# Patient Record
Sex: Female | Born: 1935 | Race: Black or African American | Hispanic: No | Smoking: Never smoker
Health system: Southern US, Community
[De-identification: ages and names within clinical notes are randomized; demographics above are authoritative.]

## PROBLEM LIST (undated history)

## (undated) DIAGNOSIS — F0280 Dementia in other diseases classified elsewhere without behavioral disturbance: Secondary | ICD-10-CM

## (undated) DIAGNOSIS — G309 Alzheimer's disease, unspecified: Secondary | ICD-10-CM

## (undated) DIAGNOSIS — K5790 Diverticulosis of intestine, part unspecified, without perforation or abscess without bleeding: Secondary | ICD-10-CM

## (undated) DIAGNOSIS — N289 Disorder of kidney and ureter, unspecified: Secondary | ICD-10-CM

## (undated) DIAGNOSIS — I10 Essential (primary) hypertension: Secondary | ICD-10-CM

## (undated) DIAGNOSIS — R7303 Prediabetes: Secondary | ICD-10-CM

## (undated) HISTORY — PX: NO PAST SURGERIES: SHX2092

---

## 2005-04-09 ENCOUNTER — Ambulatory Visit: Payer: Self-pay

## 2006-10-20 ENCOUNTER — Emergency Department: Payer: Self-pay | Admitting: Emergency Medicine

## 2006-10-22 ENCOUNTER — Ambulatory Visit: Payer: Self-pay | Admitting: Surgery

## 2006-10-22 ENCOUNTER — Other Ambulatory Visit: Payer: Self-pay

## 2006-11-27 ENCOUNTER — Ambulatory Visit: Payer: Self-pay | Admitting: Surgery

## 2015-12-30 ENCOUNTER — Encounter: Payer: Self-pay | Admitting: Emergency Medicine

## 2015-12-30 ENCOUNTER — Observation Stay
Admission: EM | Admit: 2015-12-30 | Discharge: 2016-01-01 | Disposition: A | Discharge status: Home / Self Care | Payer: Medicare (Managed Care) | Attending: Internal Medicine | Admitting: Internal Medicine

## 2015-12-30 ENCOUNTER — Emergency Department: Payer: Medicare (Managed Care)

## 2015-12-30 DIAGNOSIS — Z79899 Other long term (current) drug therapy: Secondary | ICD-10-CM | POA: Insufficient documentation

## 2015-12-30 DIAGNOSIS — G9341 Metabolic encephalopathy: Secondary | ICD-10-CM | POA: Diagnosis not present

## 2015-12-30 DIAGNOSIS — Z7982 Long term (current) use of aspirin: Secondary | ICD-10-CM | POA: Insufficient documentation

## 2015-12-30 DIAGNOSIS — F0390 Unspecified dementia without behavioral disturbance: Secondary | ICD-10-CM | POA: Diagnosis not present

## 2015-12-30 DIAGNOSIS — B9620 Unspecified Escherichia coli [E. coli] as the cause of diseases classified elsewhere: Secondary | ICD-10-CM | POA: Insufficient documentation

## 2015-12-30 DIAGNOSIS — N390 Urinary tract infection, site not specified: Secondary | ICD-10-CM | POA: Diagnosis present

## 2015-12-30 DIAGNOSIS — I10 Essential (primary) hypertension: Secondary | ICD-10-CM | POA: Diagnosis not present

## 2015-12-30 DIAGNOSIS — R410 Disorientation, unspecified: Secondary | ICD-10-CM | POA: Diagnosis present

## 2015-12-30 HISTORY — DX: Essential (primary) hypertension: I10

## 2015-12-30 LAB — URINALYSIS COMPLETE WITH MICROSCOPIC (ARMC ONLY)
BILIRUBIN URINE: NEGATIVE
GLUCOSE, UA: NEGATIVE mg/dL
HGB URINE DIPSTICK: NEGATIVE
KETONES UR: NEGATIVE mg/dL
NITRITE: POSITIVE — AB
Protein, ur: 30 mg/dL — AB
RBC / HPF: NONE SEEN RBC/hpf (ref 0–5)
SPECIFIC GRAVITY, URINE: 1.019 (ref 1.005–1.030)
pH: 5 (ref 5.0–8.0)

## 2015-12-30 LAB — COMPREHENSIVE METABOLIC PANEL
ALK PHOS: 101 U/L (ref 38–126)
ALT: 25 U/L (ref 14–54)
ANION GAP: 5 (ref 5–15)
AST: 23 U/L (ref 15–41)
Albumin: 3.8 g/dL (ref 3.5–5.0)
BILIRUBIN TOTAL: 0.5 mg/dL (ref 0.3–1.2)
BUN: 15 mg/dL (ref 6–20)
CALCIUM: 9.3 mg/dL (ref 8.9–10.3)
CO2: 27 mmol/L (ref 22–32)
Chloride: 107 mmol/L (ref 101–111)
Creatinine, Ser: 0.9 mg/dL (ref 0.44–1.00)
GFR calc non Af Amer: 58 mL/min — ABNORMAL LOW (ref >60)
Glucose, Bld: 86 mg/dL (ref 65–99)
POTASSIUM: 3.7 mmol/L (ref 3.5–5.1)
Sodium: 139 mmol/L (ref 135–145)
TOTAL PROTEIN: 8.3 g/dL — AB (ref 6.5–8.1)

## 2015-12-30 LAB — CBC WITH DIFFERENTIAL/PLATELET
BASOS ABS: 0 10*3/uL (ref 0–0.1)
Basophils Relative: 1 %
Eosinophils Absolute: 0.1 10*3/uL (ref 0–0.7)
Eosinophils Relative: 1 %
HEMATOCRIT: 43.4 % (ref 35.0–47.0)
Hemoglobin: 14.3 g/dL (ref 12.0–16.0)
LYMPHS ABS: 1.2 10*3/uL (ref 1.0–3.6)
LYMPHS PCT: 14 %
MCH: 28.6 pg (ref 26.0–34.0)
MCHC: 32.9 g/dL (ref 32.0–36.0)
MCV: 87 fL (ref 80.0–100.0)
MONO ABS: 0.6 10*3/uL (ref 0.2–0.9)
MONOS PCT: 7 %
NEUTROS ABS: 6.4 10*3/uL (ref 1.4–6.5)
Neutrophils Relative %: 77 %
Platelets: 150 10*3/uL (ref 150–440)
RBC: 5 MIL/uL (ref 3.80–5.20)
RDW: 14.3 % (ref 11.5–14.5)
WBC: 8.2 10*3/uL (ref 3.6–11.0)

## 2015-12-30 LAB — LIPASE, BLOOD: LIPASE: 56 U/L — AB (ref 11–51)

## 2015-12-30 MED ORDER — DONEPEZIL HCL 5 MG PO TABS
5.0000 mg | ORAL_TABLET | Freq: Every day | ORAL | Status: DC
  Administered 2015-12-30 – 2015-12-31 (×2): 5 mg via ORAL
  Filled 2015-12-30 (×2): qty 1

## 2015-12-30 MED ORDER — ENOXAPARIN SODIUM 40 MG/0.4ML ~~LOC~~ SOLN
40.0000 mg | SUBCUTANEOUS | Status: DC
  Administered 2015-12-30 – 2015-12-31 (×2): 40 mg via SUBCUTANEOUS
  Filled 2015-12-30 (×2): qty 0.4

## 2015-12-30 MED ORDER — ACETAMINOPHEN 325 MG PO TABS: 650.0000 mg | ORAL_TABLET | Freq: Four times a day (QID) | ORAL | Status: DC | PRN

## 2015-12-30 MED ORDER — DEXTROSE 5 % IV SOLN
1.0000 g | Freq: Once | INTRAVENOUS | Status: AC
  Administered 2015-12-30: 1 g via INTRAVENOUS

## 2015-12-30 MED ORDER — ONDANSETRON HCL 4 MG PO TABS
4.0000 mg | ORAL_TABLET | Freq: Four times a day (QID) | ORAL | Status: DC | PRN
Start: 2015-12-30 — End: 2016-01-01

## 2015-12-30 MED ORDER — AMLODIPINE BESYLATE 5 MG PO TABS
5.0000 mg | ORAL_TABLET | Freq: Every day | ORAL | Status: DC
  Administered 2015-12-31 – 2016-01-01 (×2): 5 mg via ORAL
  Filled 2015-12-30 (×2): qty 1

## 2015-12-30 MED ORDER — AMLODIPINE BESYLATE 5 MG PO TABS
5.0000 mg | ORAL_TABLET | Freq: Once | ORAL | Status: AC
  Administered 2015-12-30: 5 mg via ORAL

## 2015-12-30 MED ORDER — ACETAMINOPHEN 650 MG RE SUPP: 650.0000 mg | Freq: Four times a day (QID) | RECTAL | Status: DC | PRN

## 2015-12-30 MED ORDER — DEXTROSE 5 % IV SOLN
1.0000 g | INTRAVENOUS | Status: DC
  Filled 2015-12-30: qty 10

## 2015-12-30 MED ORDER — SODIUM CHLORIDE 0.9 % IV SOLN
INTRAVENOUS | Status: DC
  Administered 2015-12-30 – 2015-12-31 (×2): via INTRAVENOUS

## 2015-12-30 MED ORDER — OXYCODONE HCL 5 MG PO TABS: 5.0000 mg | ORAL_TABLET | ORAL | Status: DC | PRN

## 2015-12-30 MED ORDER — ASPIRIN EC 81 MG PO TBEC
81.0000 mg | DELAYED_RELEASE_TABLET | Freq: Every day | ORAL | Status: DC
  Filled 2015-12-30: qty 1

## 2015-12-30 MED ORDER — ONDANSETRON HCL 4 MG/2ML IJ SOLN: 4.0000 mg | Freq: Four times a day (QID) | INTRAMUSCULAR | Status: DC | PRN

## 2015-12-30 MED ORDER — SODIUM CHLORIDE 0.9 % IV BOLUS (SEPSIS)
500.0000 mL | Freq: Once | INTRAVENOUS | Status: AC
  Administered 2015-12-30: 500 mL via INTRAVENOUS

## 2015-12-30 NOTE — H&P (Signed)
[facility] [place] at [facility]   PATIENT NAME: Neena Beecham    MR#:  [id number]  DATE OF BIRTH:  January 27, 1931   DATE OF ADMISSION:  12/30/2015  PRIMARY CARE PHYSICIAN: No primary care provider on file.   REQUESTING/REFERRING PHYSICIAN: Quale  CHIEF COMPLAINT:   Chief Complaint  Patient presents with  . Weakness  Confusion  HISTORY OF PRESENT ILLNESS:  Odette Watanabe  is a 82 y.o. female with a known history of Dementia, essential hypertension who is presenting for nursing facility with worsening mental status. Patient unable to provide meaningful information given mental status history obtained from emergency department staff. One day duration of patient "moving slow" smelling of urine brought to Hospital further workup and evaluation. Once again patient unable to provide meaningful information given mental status  PAST MEDICAL HISTORY:   Past Medical History:  Diagnosis Date  . Hypertension     PAST SURGICAL HISTORY:   Past Surgical History:  Procedure Laterality Date  . NO PAST SURGERIES      SOCIAL HISTORY:   Social History  Substance Use Topics  . Smoking status: Never Smoker  . Smokeless tobacco: Never Used  . Alcohol use Not on file    FAMILY HISTORY:   Family History  Problem Relation Age of Onset  . Hypertension Other     DRUG ALLERGIES:  Allergies no known allergies  REVIEW OF SYSTEMS:  Unable to obtain given mental status medical condition   MEDICATIONS AT HOME:   Prior to Admission medications   Medication Sig Start Date End Date Taking? Authorizing Provider  amLODipine (NORVASC) 5 MG tablet Take 5 mg by mouth daily.   Yes Historical Provider, MD  aspirin 81 MG tablet Take 81 mg by mouth daily.   Yes Historical Provider, MD  donepezil (ARICEPT) 5 MG tablet Take 5 mg by mouth at bedtime.   Yes Historical Provider, MD      VITAL SIGNS:  Blood pressure (!) 147/90, pulse 61, temperature 97.8 F (36.6 C), temperature  source Rectal, resp. rate 16, height  (1.575 m), weight 69.9 kg (154 lb), SpO2 98 %.  PHYSICAL EXAMINATION:   VITAL SIGNS: Vitals:   12/30/15 1135 12/30/15 1222  BP:  (!) 147/90  Pulse:    Resp:    Temp: 97.8 F (36.6 C)    GENERAL:82 y.o.female moderate distress given mental status.  HEAD: Normocephalic, atraumatic.  EYES: Pupils equal, round, reactive to light. Unable to assess extraocular muscles given mental status/medical condition. No scleral icterus.  MOUTH: Moist mucosal membrane. Dentition intact. No abscess noted.  EAR, NOSE, THROAT: Clear without exudates. No external lesions.  NECK: Supple. No thyromegaly. No nodules. No JVD.  PULMONARY: Clear to ascultation, without wheeze rails or rhonci. No use of accessory muscles, Good respiratory effort. good air entry bilaterally CHEST: Nontender to palpation.  CARDIOVASCULAR: S1 and S2. Regular rate and rhythm. No murmurs, rubs, or gallops. No edema. Pedal pulses 2+ bilaterally.  GASTROINTESTINAL: Soft, nontender, nondistended. No masses. Positive bowel sounds. No hepatosplenomegaly.  MUSCULOSKELETAL: No swelling, clubbing, or edema. Range of motion full in all extremities.  NEUROLOGIC: Unable to assess given mental status/medical condition SKIN: No ulceration, lesions, rashes, or cyanosis. Skin warm and dry. Turgor intact.  PSYCHIATRIC: Unable to assess given mental status/medical condition-patient can mumble incoherently and nod head but unable to follow directions or answer questions     LABORATORY PANEL:   CBC  Recent Labs Lab 12/30/15 1157  WBC 8.2  HGB 14.3  HCT 43.4  PLT 150   ------------------------------------------------------------------------------------------------------------------  Chemistries   Recent Labs Lab 12/30/15 1157  NA 139  K 3.7  CL 107  CO2 27  GLUCOSE 86  BUN 15  CREATININE 0.90  CALCIUM 9.3  AST 23  ALT 25  ALKPHOS 101  BILITOT 0.5    ------------------------------------------------------------------------------------------------------------------  Cardiac Enzymes No results for input(s): TROPONINI in the last 168 hours. ------------------------------------------------------------------------------------------------------------------  RADIOLOGY:  Dg Chest Portable 1 View  Result Date: 12/30/2015 CLINICAL DATA:  Confusion EXAM: PORTABLE CHEST 1 VIEW COMPARISON:  None. FINDINGS: Heart size is top-normal accounting for portable hypoinspiratory technique. Normal mediastinal contour No pneumothorax. No pleural effusion. Lungs appear clear, with no acute consolidative airspace disease and no pulmonary edema. Surgical clips in the right upper quadrant of the abdomen. IMPRESSION: Limited hypoinspiratory portable chest radiographs with no active disease. Electronically Signed   By: Delbert PhenixJason A Poff M.D.   On: 12/30/2015 11:56    EKG:   Orders placed or performed during the hospital encounter of 12/30/15  . ED EKG  . ED EKG    IMPRESSION AND PLAN:   82 year old African-American female history of essential hypertension, dementia presenting with worsening mental status and weakness.  1. Acute  metabolic encephalopathy secondary to urinary tract infection site unspecified: Ceftriaxone, follow urine culture  2. Essential hypertension: Norvasc 3. Dementia: Donepezil  Patient family unable to be reached at this time    All the records are reviewed and case discussed with ED provider. Management plans discussed with the patient, family and they are in agreement.  CODE STATUS: full   TOTAL TIME TAKING CARE OF THIS PATIENT: 33 minutes.    Nakaya Mishkin,  Mardi MainlandDavid K M.D on 12/30/2015 at 1:30 PM  Between 7am to 6pm - Pager - [phone]  After 6pm: House Pager: - [phone]  [facility] [place] [facility]  Office  [phone]  CC: Primary care physician; No primary care provider on file.

## 2015-12-30 NOTE — Progress Notes (Signed)
Spoke with pts niece Radiographer, therapeuticmeeta Clobett who confirms pt has no allergies. Notified Jennifer with pharmacy.

## 2015-12-30 NOTE — ED Provider Notes (Signed)
[facility] Emergency Department Provider Note ____________________________________________   First MD Initiated Contact with Patient 12/30/15 1106     (approximate)  I have reviewed the triage vital signs and the nursing notes.   HISTORY  Chief Complaint Weakness  EM caveat: The patient has severe dementia, altered mental status and is not speaking  HPI Nancy Montgomery is a 82 y.o. female the past medical history of dementia, hypertension.  History is very limited. Evidently the patient vomited once some time in the last 24 hours, and though she has dementia it seems to be slightly worse today prompting ER evaluation. EMS noted foul-smelling odor to the urine.  Patient herself does not appear in pain or have complaint. She smiles, giggles slightly, but does not carry on any conversation.   Past Medical History:  Diagnosis Date  . Hypertension     Hypertension   Patient Active Problem List   Diagnosis Date Noted  . Acute delirium 12/30/2015  . Metabolic encephalopathy 12/30/2015    Past Surgical History:  Procedure Laterality Date  . NO PAST SURGERIES      Prior to Admission medications   Medication Sig Start Date End Date Taking? Authorizing Provider  amLODipine (NORVASC) 5 MG tablet Take 5 mg by mouth daily.   Yes Historical Provider, MD  aspirin 81 MG tablet Take 81 mg by mouth daily.   Yes Historical Provider, MD  donepezil (ARICEPT) 5 MG tablet Take 5 mg by mouth at bedtime.   Yes Historical Provider, MD  Norvasc Baby aspirin   Allergies Review of patient's allergies indicates no known allergies.  Family History  Problem Relation Age of Onset  . Hypertension Other     Social History Social History  Substance Use Topics  . Smoking status: Never Smoker  . Smokeless tobacco: Never Used  . Alcohol use Not on file  Unknown EM caveat  Review of Systems EM caveat  ____________________________________________   PHYSICAL  EXAM:  VITAL SIGNS: ED Triage Vitals  Enc Vitals Group     BP 12/30/15 1047 (!) 142/76     Pulse Rate 12/30/15 1047 61     Resp 12/30/15 1047 16     Temp 12/30/15 1047 (!) 96.5 F (35.8 C)     Temp Source 12/30/15 1047 Axillary     SpO2 12/30/15 1047 98 %     Weight 12/30/15 1052 154 lb (69.9 kg)     Height 12/30/15 1052 5\' 2"  (1.575 m)     Head Circumference --      Peak Flow --      Pain Score --      Pain Loc --      Pain Edu? --      Excl. in GC? --    EM caveat  Constitutional: Alert and sitting up, smiling, in no distress.  Eyes: Conjunctivae are normal. PERRL. EOMI. Head: Atraumatic. Nose: No congestion/rhinnorhea. Mouth/Throat: Mucous membranes are moist.  Oropharynx non-erythematous. Neck: No stridor.  No meningismus Cardiovascular: Normal rate, regular rhythm. Grossly normal heart sounds.  Good peripheral circulation. Respiratory: Normal respiratory effort.  No retractions. Lungs CTAB. Gastrointestinal: Soft and nontender. No distention.  Musculoskeletal: No lower extremity tenderness nor edema. Neurologic:  The patient does not verbalize. She does giggle. Her smile is noted to be equal. She moves all extremities with equal strength, and is picking slightly at her pulse oximetry and telemetry cables at times. Neuro examination and details difficult because of the patient's mental status.  She does not follow commands and does not verbalize. No gross focal neurologic deficits are appreciated. Skin:  Skin is warm, dry and intact. No rash noted. Psychiatric: Mood and affect are calm, though occasionally picking at cables.  ____________________________________________   LABS (all labs ordered are listed, but only abnormal results are displayed)  Labs Reviewed  COMPREHENSIVE METABOLIC PANEL - Abnormal; Notable for the following:       Result Value   Total Protein 8.3 (*)    GFR calc non Af Amer 58 (*)    All other components within normal limits  LIPASE, BLOOD -  Abnormal; Notable for the following:    Lipase 56 (*)    All other components within normal limits  URINALYSIS COMPLETEWITH MICROSCOPIC ([facility] ONLY) - Abnormal; Notable for the following:    Color, Urine AMBER (*)    APPearance CLOUDY (*)    Protein, ur 30 (*)    Nitrite POSITIVE (*)    Leukocytes, UA 1+ (*)    Bacteria, UA RARE (*)    Squamous Epithelial / LPF 0-5 (*)    All other components within normal limits  URINE CULTURE  CBC WITH DIFFERENTIAL/PLATELET   ____________________________________________  EKG  Reviewed and interpreted by me at 1236 Heart rate 70 QRS 80 QTc 450 Normal sinus rhythm, questionable right atrial enlargement, no evidence of acute ischemia. ____________________________________________  RADIOLOGY  Dg Chest Portable 1 View  Result Date: 12/30/2015 CLINICAL DATA:  Confusion EXAM: PORTABLE CHEST 1 VIEW COMPARISON:  None. FINDINGS: Heart size is top-normal accounting for portable hypoinspiratory technique. Normal mediastinal contour No pneumothorax. No pleural effusion. Lungs appear clear, with no acute consolidative airspace disease and no pulmonary edema. Surgical clips in the right upper quadrant of the abdomen. IMPRESSION: Limited hypoinspiratory portable chest radiographs with no active disease. Electronically Signed   By: Delbert Phenix M.D.   On: 12/30/2015 11:56    ____________________________________________   PROCEDURES  Procedure(s) performed: None  Procedures  Critical Care performed: No  ____________________________________________   INITIAL IMPRESSION / ASSESSMENT AND PLAN / ED COURSE  Pertinent labs & imaging results that were available during my care of the patient were reviewed by me and considered in my medical decision making (see chart for details).    Attempted to call the patient's listed emergency contact, Birdena Crandall, but returns that the line is no longer valid.  The patient presents for concerns of altered mental status.  She appears to be delirious, picking at cables, giggling inappropriately at times, but shows no evidence of focal deficit. Her core temperature is notably slightly low, and this raises suspicion for possible metabolic and/or infectious etiology. I see no evidence of acute neurologic deficit, though further evaluation does not show an acute cause that would explain potential delirium and alteration of mental status proceed with CT of the head.  ----------------------------------------- 1:04 PM on 12/30/2015 -----------------------------------------  Second attempt made to reach patient's family, unable. Patient does demonstrate findings consistent with a urinary tract infection with rare bacteria, leukocytes, and many whites. We will admit the patient, initiated IV antibiotic including Rocephin, urine culture. Admit for significant alteration in mental status, symptoms of delirium. Place a social work consult to assist in contacting patient family.  Clinical Course  Value Comment By Time  Sodium: 139 (Reviewed) Sharyn Creamer, MD 08/13 1310     ____________________________________________   FINAL CLINICAL IMPRESSION(S) / ED DIAGNOSES  Final diagnoses:  Delirium  Acute urinary tract infection      NEW MEDICATIONS STARTED  DURING THIS VISIT:  New Prescriptions   No medications on file     Note:  This document was prepared using Dragon voice recognition software and may include unintentional dictation errors.     Sharyn Creamer, MD 12/30/15 1359

## 2015-12-30 NOTE — ED Triage Notes (Signed)
Pt arrive by EMS after family called stating that the pt was "moving slower than normal" today and had vomited once upon waking. Pt has hx of dementia and is unable to answer questions. EMS did notice a "strong urine" smell upon arrival.

## 2015-12-30 NOTE — ED Notes (Signed)
Informed RN bed ready  1351

## 2015-12-30 NOTE — Progress Notes (Signed)
Spoke with RN - patient unable to communicate and no family present to verify allergies.   Nancy Montgomery 5:51 PM

## 2015-12-30 NOTE — ED Notes (Signed)
Pt triaged as much as possible at this time. Pt unable to answer questions. Per EMS family are on the way to ED.

## 2015-12-30 NOTE — ED Notes (Signed)
Assisted RN with cleaning pt and getting rectal temp. Assisted x-ray with holding pt while x-ray performed

## 2015-12-30 NOTE — ED Notes (Signed)
Socks placed on pt.  

## 2015-12-30 NOTE — Clinical Social Work Note (Signed)
Clinical Social Work Assessment  Patient Details  Name: Nancy Montgomery MRN: [id number] Date of Birth: 11/29/1930  Date of referral:  12/30/15               Reason for consult:  Family Concerns                Permission sought to share information with:  Family Supports Permission granted to share information::     Name::     Agricultural engineerCoretta Losito  Agency::     Relationship::     Contact Information:     Housing/Transportation Living arrangements for the past 2 months:  Single Family Home Source of Information:  Other (Comment Required) (Patient's niece) Patient Interpreter Needed:  None Criminal Activity/Legal Involvement Pertinent to Current Situation/Hospitalization:  No - Comment as needed Significant Relationships:  Other Family Members Lives with:  Spouse Do you feel safe going back to the place where you live?  Yes Need for family participation in patient care:  Yes (Comment)  Care giving concerns:  Patient arrived at ED with AMS and no contact information.   Social Worker assessment / plan:  Patient was alert and oriented x1. Patient is not currently verbalizing; her niece, Marella BileCoretta, was in the room during assessment and provided all information.  Patient lives in a single family home with her husband. Both she and her husband have dementia and are cared for by her niece, Radiographer, therapeuticmeeta Clobett. CSW asked for permission to update the demographic contact info for future use. Family and patient agreed verbally.   Patient at baseline ambulates with no assistance and no DME. She is able to feed independently and receives assistance with bathing and dressing. Patient uses adult diapers due to incontinence of bladder and bowel. PT has been ordered; awaiting recommendations   Employment status:  Retired Health and safety inspectornsurance information:  Teacher, English as a foreign languageManaged Medicare ([employer] [facility] [place]) PT Recommendations:  Not assessed at this time Information / Referral to community resources:     Patient/Family's Response to care:   Patient non-verbal but pleasant. Baseline is speaking.   Patient/Family's Understanding of and Emotional Response to Diagnosis, Current Treatment, and Prognosis:  Patient's family were able to verbalize in their own terms the roles of the CSW in care. Patient and the family thanked the CSW for attention.  Emotional Assessment Appearance:  Appears older than stated age Attitude/Demeanor/Rapport:   (Appropriate) Affect (typically observed):  Appropriate Orientation:  Oriented to Self (Patient has dementia and is AMS from baseline) Alcohol / Substance use:  Never Used Psych involvement (Current and /or in the community):  No (Comment)  Discharge Needs  Concerns to be addressed:  Cognitive Concerns, Discharge Planning Concerns Readmission within the last 30 days:  No Current discharge risk:  Cognitively Impaired Barriers to Discharge:  Continued Medical Work up   UAL CorporationKaren M Baley Lorimer, LCSW 12/30/2015, 4:19 PM

## 2015-12-31 MED ORDER — DEXTROSE 5 % IV SOLN
1.0000 g | INTRAVENOUS | Status: DC
  Filled 2015-12-31: qty 10

## 2015-12-31 MED ORDER — ASPIRIN 81 MG PO CHEW
81.0000 mg | CHEWABLE_TABLET | Freq: Every day | ORAL | Status: DC
  Administered 2015-12-31 – 2016-01-01 (×2): 81 mg via ORAL
  Filled 2015-12-31 (×2): qty 1

## 2015-12-31 NOTE — Care Management Note (Signed)
Case Management Note  Patient Details  Name: Nancy Montgomery MRN: [id number] Date of Birth: 08/31/1930  Subjective/Objective:   Spoke with Charlynn CourtSharita [facility]  [phone] She stated that [facility] would be able to accommodate any patient needs at discharge. Patient had 24 hour supervision and is seen every day. Plan is for patient to return home with [facility] at discharge. More to follow. CSW aware               Action/Plan: Home with [facility] at discharge.  Expected Discharge Date:                  Expected Discharge Plan:  Home w Home Health Services  In-House Referral:  Clinical Social Work  Discharge planning Services  CM Consult  Post Acute Care Choice:    Choice offered to:  NA  DME Arranged:  N/A DME Agency:  NA  HH Arranged:    HH Agency:  Other - See comment ([facility])  Status of Service:  In process, will continue to follow  If discussed at Long Length of Stay Meetings, dates discussed:    Additional Comments:  Adonis HugueninBerkhead, Claudie Brickhouse L, RN 12/31/2015, 11:24 AM

## 2015-12-31 NOTE — Evaluation (Signed)
Physical Therapy Evaluation Patient Details Name: Nancy Montgomery MRN: [id number] DOB: 08/26/1930 Today's Date: 12/31/2015   History of Present Illness  Pt is an 82 yr old female with known history of dementia and hypertension presenting with worsening mental status and 1 day duration of "moving slow" and smelling of urine. Admitted with acute delirium and metabolic encephalopathy. Pt unable to provide meaningful information on evaluation given baseline dementia.   Clinical Impression  Prior to admission, pt was independent with household mobility (no AD) though required assist for ADLs of bathing, dressing, and grooming. Pt lives with her spouse, whom is also with dementia, and niece who is a caretaker for them both. Pt has 24/7 supervision and/or assist via family and 5x/week [facility] services (typically 10:00-16:00). Currently pt is max A x2 for bed mobility (this may be d/t difficulty following commands/unfamiliarity of hospital environment), and min guard x2 for sit <> stand transfers and ambulation x 24600ft without AD.  Pt is with limited ability to follow commands, and thus formal strength and balance testing were unable to be assessed. Pt appears to be near baseline and should be safe to d/c home with 24/7 supervision and assist when medically appropriate. Will follow pt with acute skilled PT while still hospitalized to increase OOB activity.     Follow Up Recommendations No PT follow up;Supervision/Assistance - 24 hour    Equipment Recommendations  None recommended by PT    Recommendations for Other Services       Precautions / Restrictions Precautions Precautions: Fall Restrictions Weight Bearing Restrictions: No      Mobility  Bed Mobility Overal bed mobility: Needs Assistance;+2 for physical assistance Bed Mobility: Supine to Sit Supine to sit: Max assist;+2 for physical assistance General bed mobility comments: Pt with difficulty following commands to sit EOB, thus max A x2  provided to complete the transfer. Pt able to maintain balance sitting EOB with supervision, with and without LE support.  Transfers Overall transfer level: Needs assistance Equipment used: 1 person hand held assist Transfers: Sit to/from Stand Sit to Stand: Min guard;+2 safety General transfer comment: Pt utilizing bilat handheld assist to initiate the transfer, but does not rely on the UE support to assume stand. Min guard x2 for safety but no physical assist required. Increased time.  Ambulation/Gait Ambulation/Gait assistance: Min guard;+2 safety Ambulation Distance (Feet): 200 Feet Assistive device: None Gait Pattern/deviations: WFL(Within Functional Limits);Step-through pattern;Trunk flexed General Gait Details: Pt able to ambulate RN unit with min guard x2 for safety. No significant gait deviations or LOB. Fluctuating gait speed but grossly WFL. Occasional hand held assist for steering/obstacle navigation.  Stairs      Balance Overall balance assessment: Needs assistance Sitting-balance support: Bilateral upper extremity supported Sitting balance-Leahy Scale: Good Standing balance support: No upper extremity supported Standing balance-Leahy Scale: Good      Pertinent Vitals/Pain Pain Assessment:  (0/10 via PAINAD scale)  HR and O2 monitored throughout session and maintained WFL.     Home Living Family/patient expects to be discharged to:: Private residence Living Arrangements: Spouse/significant other;Other relatives Available Help at Discharge: Family;Friend(s);Available 24 hours/day ([facility] services) Type of Home: House Home Access: Stairs to enter Entrance Stairs-Rails: Left Entrance Stairs-Number of Steps: 2 Home Layout: One level   Prior Function Level of Independence: Independent;Needs assistance  Comments: Pt is largely non-communicative d/t baseline dementa; home environment and PLOF obtained throught chart review and conversation with pt's neice. Pt able to  ambulate "all over" independently without AD. Requires assist with  bathing and dressing. Has 24/7 supervision[facility]      Extremity/TrunkAssessment   Upper Extremity Assessment: Generalized weakness  Lower Extremity Assessment: Generalized weakness  Cervical / Trunk Assessment: Kyphotic    Communication   Communication: Expressive difficulties (Pt largely non-communicative; just giggles)  Cognition Arousal/Alertness: Awake/alert Behavior During Therapy: WFL for tasks assessed/performed Overall Cognitive Status: History of cognitive impairments - at baseline    General Comments Nursing cleared pt for participation in physical therapy.      Exercises Other Exercises Other Exercises: Pt unable to follow commands for supine TherEx this date. Pt able to ambulate 2200ft on unit for functional LE strengthening.      Assessment/Plan    PT Assessment Patient needs continued PT services  PT Diagnosis Generalized weakness   PT Problem List Decreased strength;Decreased mobility  PT Treatment Interventions Gait training;Stair training;Functional mobility training;Therapeutic activities;Therapeutic exercise   PT Goals (Current goals can be found in the Care Plan section) Acute Rehab PT Goals PT Goal Formulation: Patient unable to participate in goal setting Time For Goal Achievement: 01/14/16 Potential to Achieve Goals: Good    Frequency Min 2X/week   Barriers to discharge           End of Session Equipment Utilized During Treatment: Gait belt Activity Tolerance: Patient tolerated treatment well Patient left: in chair;with call bell/phone within reach;with chair alarm set;with family/visitor present Nurse Communication: Mobility status         Time: [id number] PT Time Calculation (min) (ACUTE ONLY): 39 min   Charges:         PT G Codes:        Tobby Fawcett, SPT 12/31/2015, 12:11 PM

## 2015-12-31 NOTE — Progress Notes (Signed)
PT is recommending no PT follow up. RN Case Manager is aware of above. Please reconsult if future social work needs arise. CSW signing off.   Baker Hughes IncorporatedBailey Nivan Melendrez, LCSW [phone]

## 2015-12-31 NOTE — Progress Notes (Signed)
Patient is followed by [facility]. [facility] staff member came to visit patient today. Per [facility] staff member patient has 24/7 supervision and comes to the [facility] center 5 days per week. Per [facility] plan is for patient to return home and PT may recommend SNF however that is patient's baseline. CSW will continue to follow and assist as needed.   B[employer] Hughes [employer]orporatedBailey Sharah Finnell, LCSW [phone]

## 2015-12-31 NOTE — Progress Notes (Signed)
[facility] [place] [facility]   PATIENT NAME: Nancy Montgomery    MR#:  [id number]  DATE OF BIRTH:  10/02/1930  SUBJECTIVE:  CHIEF COMPLAINT:   Chief Complaint  Patient presents with  . Weakness   Sitting in the recliner Dementia and unable to contribute to history  REVIEW OF SYSTEMS:    Review of Systems  Unable to perform ROS: Dementia    DRUG ALLERGIES:  No Known Allergies  VITALS:  Blood pressure 138/70, pulse 68, temperature 98.6 F (37 C), temperature source Oral, resp. rate 18, height 5\' 2"  (1.575 m), weight 69.9 kg (154 lb), SpO2 98 %.  PHYSICAL EXAMINATION:   Physical Exam  GENERAL:  82 y.o.-year-old patient lying in the bed with no acute distress.  EYES: Pupils equal, round, reactive to light and accommodation. No scleral icterus. Extraocular muscles intact.  HEENT: Head atraumatic, normocephalic. Oropharynx and nasopharynx clear.  NECK:  Supple, no jugular venous distention. No thyroid enlargement, no tenderness.  LUNGS: Normal breath sounds bilaterally, no wheezing, rales, rhonchi. No use of accessory muscles of respiration.  CARDIOVASCULAR: S1, S2 normal. No murmurs, rubs, or gallops.  ABDOMEN: Soft, nontender, nondistended. Bowel sounds present. No organomegaly or mass.  EXTREMITIES: No cyanosis, clubbing or edema b/l.    NEUROLOGIC: Moves all 4 extremities equally. Not following commands PSYCHIATRIC: The patient is drowzy SKIN: No obvious rash, lesion, or ulcer.   LABORATORY PANEL:   CBC  Recent Labs Lab 12/30/15 1157  WBC 8.2  HGB 14.3  HCT 43.4  PLT 150   ------------------------------------------------------------------------------------------------------------------ Chemistries   Recent Labs Lab 12/30/15 1157  NA 139  K 3.7  CL 107  CO2 27  GLUCOSE 86  BUN 15  CREATININE 0.90  CALCIUM 9.3  AST 23  ALT 25  ALKPHOS 101  BILITOT 0.5    ------------------------------------------------------------------------------------------------------------------  Cardiac Enzymes No results for input(s): TROPONINI in the last 168 hours. ------------------------------------------------------------------------------------------------------------------  RADIOLOGY:  Dg Chest Portable 1 View  Result Date: 12/30/2015 CLINICAL DATA:  Confusion EXAM: PORTABLE CHEST 1 VIEW COMPARISON:  None. FINDINGS: Heart size is top-normal accounting for portable hypoinspiratory technique. Normal mediastinal contour No pneumothorax. No pleural effusion. Lungs appear clear, with no acute consolidative airspace disease and no pulmonary edema. Surgical clips in the right upper quadrant of the abdomen. IMPRESSION: Limited hypoinspiratory portable chest radiographs with no active disease. Electronically Signed   By: Delbert PhenixJason A Poff M.D.   On: 12/30/2015 11:56     ASSESSMENT AND PLAN:   82 year old African-American female history of essential hypertension, dementia presenting with worsening mental status and weakness.  1. Acute  metabolic encephalopathy over baseline dementia secondary to urinary tract infection Ceftriaxone Urine cx are pending. Niece( HCPOA) at bedside and patient is improving per her.  2. Essential hypertension: Norvasc  3. Dementia: Donepezil  All the records are reviewed and case discussed with Care Management/Social Workerr. Management plans discussed with the patient, family and they are in agreement.  CODE STATUS: FULL CODE  DVT Prophylaxis: SCDs  TOTAL TIME TAKING CARE OF THIS PATIENT: 35 minutes.   POSSIBLE D/C IN 1-2 DAYS, DEPENDING ON CLINICAL CONDITION.  Milagros LollSudini, Maki Hege R M.D on 12/31/2015 at 12:46 PM  Between 7am to 6pm - Pager - [phone]  After 6pm go to [email or web address] - password [email or web address] [email or web address]  [facility]  Office  [phone]  CC: Primary care physician; No primary care provider on file.  Note:  This dictation was prepared with Dragon dictation along with smaller  Company secretary. Any transcriptional errors that result from this process are unintentional.

## 2016-01-01 LAB — URINE CULTURE

## 2016-01-01 MED ORDER — CEPHALEXIN 500 MG PO CAPS: 500.0000 mg | ORAL_CAPSULE | Freq: Four times a day (QID) | ORAL | 0 refills | Status: DC

## 2016-01-01 NOTE — Discharge Summary (Signed)
[facility] [place] at [facility]   PATIENT NAME: Nancy Montgomery    MR#:  [id number]  DATE OF BIRTH:  06/21/1930  DATE OF ADMISSION:  12/30/2015   ADMITTING PHYSICIAN: Wyatt Hasteavid K Hower, MD  DATE OF DISCHARGE: 01/01/2016  3:58 PM  PRIMARY CARE PHYSICIAN: No primary care provider on file.   ADMISSION DIAGNOSIS:  Delirium [R41.0] Acute urinary tract infection [N39.0] DISCHARGE DIAGNOSIS:  Active Problems:   Acute delirium   Metabolic encephalopathy UTI SECONDARY DIAGNOSIS:   Past Medical History:  Diagnosis Date  . Hypertension    HOSPITAL COURSE:  82 year old African-American female history of essential hypertension, dementia presenting with worsening mental status and weakness.  1. Acute metabolic encephalopathy over baseline dementia secondary to urinary tract infection Has been treated with iv Ceftriaxone Urine cx: E Coli. Change to keflex. I discussed with Niece( HCPOA) at bedside.  2. Essential hypertension: Norvasc  3. Dementia: Donepezil  DISCHARGE CONDITIONS:  Stable, discharged to home today. CONSULTS OBTAINED:  Treatment Team:  Wyatt Hasteavid K Hower, MD DRUG ALLERGIES:  No Known Allergies DISCHARGE MEDICATIONS:     Medication List    TAKE these medications   amLODipine 5 MG tablet Commonly known as:  NORVASC Take 5 mg by mouth daily.   aspirin 81 MG tablet Take 81 mg by mouth daily.   cephALEXin 500 MG capsule Commonly known as:  KEFLEX Take 1 capsule (500 mg total) by mouth 4 (four) times daily.   donepezil 5 MG tablet Commonly known as:  ARICEPT Take 5 mg by mouth at bedtime.        DISCHARGE INSTRUCTIONS:   DIET:  regular DISCHARGE CONDITION:  Stable. ACTIVITY:  As tolerated. DISCHARGE LOCATION:    If you experience worsening of your admission symptoms, develop shortness of breath, life threatening emergency, suicidal or homicidal thoughts you must seek medical attention immediately by calling 911 or calling your  MD immediately  if symptoms less severe.  You Must read complete instructions/literature along with all the possible adverse reactions/side effects for all the Medicines you take and that have been prescribed to you. Take any new Medicines after you have completely understood and accpet all the possible adverse reactions/side effects.   Please note  You were cared for by a hospitalist during your hospital stay. If you have any questions about your discharge medications or the care you received while you were in the hospital after you are discharged, you can call the unit and asked to speak with the hospitalist on call if the hospitalist that took care of you is not available. Once you are discharged, your primary care physician will handle any further medical issues. Please note that NO REFILLS for any discharge medications will be authorized once you are discharged, as it is imperative that you return to your primary care physician (or establish a relationship with a primary care physician if you do not have one) for your aftercare needs so that they can reassess your need for medications and monitor your lab values.    On the day of Discharge:  VITAL SIGNS:  Blood pressure 105/76, pulse 71, temperature 98 F (36.7 C), temperature source Oral, resp. rate 18, height 5\' 2"  (1.575 m), weight 154 lb (69.9 kg), SpO2 97 %. PHYSICAL EXAMINATION:  GENERAL:  82 y.o.-year-old patient lying in the bed with no acute distress.  EYES: Pupils equal, round, reactive to light and accommodation. No scleral icterus. Extraocular muscles intact.  HEENT: Head atraumatic, normocephalic. Oropharynx and  nasopharynx clear.  NECK:  Supple, no jugular venous distention. No thyroid enlargement, no tenderness.  LUNGS: Normal breath sounds bilaterally, no wheezing, rales,rhonchi or crepitation. No use of accessory muscles of respiration.  CARDIOVASCULAR: S1, S2 normal. No murmurs, rubs, or gallops.  ABDOMEN: Soft,  non-tender, non-distended. Bowel sounds present. No organomegaly or mass.  EXTREMITIES: No pedal edema, cyanosis, or clubbing.  NEUROLOGIC: Cranial nerves II through XII are intact. Muscle strength 5/5 in all extremities. Sensation intact. Gait not checked.  PSYCHIATRIC: The patient is demented.  SKIN: No obvious rash, lesion, or ulcer.  DATA REVIEW:   CBC  Recent Labs Lab 12/30/15 1157  WBC 8.2  HGB 14.3  HCT 43.4  PLT 150    Chemistries   Recent Labs Lab 12/30/15 1157  NA 139  K 3.7  CL 107  CO2 27  GLUCOSE 86  BUN 15  CREATININE 0.90  CALCIUM 9.3  AST 23  ALT 25  ALKPHOS 101  BILITOT 0.5     Microbiology Results  Results for orders placed or performed during the hospital encounter of 12/30/15  Urine culture     Status: Abnormal   Collection Time: 12/30/15 11:58 AM  Result Value Ref Range Status   Specimen Description URINE, CATHETERIZED  Final   Special Requests NONE  Final   Culture >=100,000 COLONIES/mL ESCHERICHIA COLI (A)  Final   Report Status 01/01/2016 FINAL  Final   Organism ID, Bacteria ESCHERICHIA COLI (A)  Final      Susceptibility   Escherichia coli - MIC*    AMPICILLIN >=32 RESISTANT Resistant     CEFAZOLIN <=4 SENSITIVE Sensitive     CEFTRIAXONE <=1 SENSITIVE Sensitive     CIPROFLOXACIN >=4 RESISTANT Resistant     GENTAMICIN <=1 SENSITIVE Sensitive     IMIPENEM <=0.25 SENSITIVE Sensitive     NITROFURANTOIN <=16 SENSITIVE Sensitive     TRIMETH/SULFA >=320 RESISTANT Resistant     AMPICILLIN/SULBACTAM 16 INTERMEDIATE Intermediate     PIP/TAZO <=4 SENSITIVE Sensitive     Extended ESBL NEGATIVE Sensitive     * >=100,000 COLONIES/mL ESCHERICHIA COLI    RADIOLOGY:  No results found.   Management plans discussed with the patient, family and they are in agreement.  CODE STATUS:     Code Status Orders        Start     Ordered   12/30/15 1310  Full code  Continuous     12/30/15 1309    Code Status History    Date Active Date  Inactive Code Status Order ID Comments User Context   This patient has a current code status but no historical code status.    Advance Directive Documentation   Flowsheet Row Most Recent Value  Type of Advance Directive  Healthcare Power of Attorney  Pre-existing out of facility DNR order (yellow form or pink MOST form)  No data  "MOST" Form in Place?  No data      TOTAL TIME TAKING CARE OF THIS PATIENT: 32 minutes.    Shaune Pollackhen, Aislinn Feliz M.D on 01/01/2016 at 5:37 PM  Between 7am to 6pm - Pager - [phone]  After 6pm go to [email or web address] - Scientist, research (life [email or web address])password [email or web address]  [facility] [place] [facility]  Office  [phone]  CC: Primary care physician; No primary care provider on file.   Note: This dictation was prepared with Dragon dictation along with smaller phrase technology. Any transcriptional errors that result from this process are unintentional.

## 2016-01-01 NOTE — Care Management (Signed)
Urine culture shows E coli .  Sensitivities back.  Await attending assessment.  Has not yet rounded.  Currently in observation.  Afebrile.  WBC from 8/13 8.2.  Sx of delirium have decreased per report.

## 2016-01-01 NOTE — Discharge Instructions (Signed)
Heart healthy diet. Activity as tolerated. Fall and aspiration precaution. Resume HH.

## 2016-01-01 NOTE — Progress Notes (Signed)
Patient was discharged home with family. IV removed with cath intact. Reviewed discharge meds and last dose given to niece. VSS.

## 2016-01-01 NOTE — Care Management (Addendum)
Patient is to discharge today to home with resumption of [facility] services.  Agency is aware and will transport patient home.  [facility] and [facility]

## 2016-08-31 ENCOUNTER — Emergency Department: Payer: Medicare (Managed Care)

## 2016-08-31 ENCOUNTER — Emergency Department
Admission: EM | Admit: 2016-08-31 | Discharge: 2016-08-31 | Disposition: A | Discharge status: Home / Self Care | Payer: Medicare (Managed Care) | Attending: Emergency Medicine | Admitting: Emergency Medicine

## 2016-08-31 DIAGNOSIS — Z79899 Other long term (current) drug therapy: Secondary | ICD-10-CM | POA: Diagnosis not present

## 2016-08-31 DIAGNOSIS — S0003XA Contusion of scalp, initial encounter: Secondary | ICD-10-CM | POA: Diagnosis not present

## 2016-08-31 DIAGNOSIS — Y999 Unspecified external cause status: Secondary | ICD-10-CM | POA: Insufficient documentation

## 2016-08-31 DIAGNOSIS — Y939 Activity, unspecified: Secondary | ICD-10-CM | POA: Diagnosis not present

## 2016-08-31 DIAGNOSIS — S0990XA Unspecified injury of head, initial encounter: Secondary | ICD-10-CM | POA: Diagnosis present

## 2016-08-31 DIAGNOSIS — Y92129 Unspecified place in nursing home as the place of occurrence of the external cause: Secondary | ICD-10-CM | POA: Insufficient documentation

## 2016-08-31 DIAGNOSIS — W19XXXA Unspecified fall, initial encounter: Secondary | ICD-10-CM

## 2016-08-31 DIAGNOSIS — I10 Essential (primary) hypertension: Secondary | ICD-10-CM | POA: Diagnosis not present

## 2016-08-31 DIAGNOSIS — W1839XA Other fall on same level, initial encounter: Secondary | ICD-10-CM | POA: Diagnosis not present

## 2016-08-31 DIAGNOSIS — Z7982 Long term (current) use of aspirin: Secondary | ICD-10-CM | POA: Diagnosis not present

## 2016-08-31 LAB — URINALYSIS, COMPLETE (UACMP) WITH MICROSCOPIC
BACTERIA UA: NONE SEEN
Bilirubin Urine: NEGATIVE
Glucose, UA: NEGATIVE mg/dL
HGB URINE DIPSTICK: NEGATIVE
Ketones, ur: NEGATIVE mg/dL
Leukocytes, UA: NEGATIVE
NITRITE: NEGATIVE
PROTEIN: NEGATIVE mg/dL
RBC / HPF: NONE SEEN RBC/hpf (ref 0–5)
Specific Gravity, Urine: 1.004 — ABNORMAL LOW (ref 1.005–1.030)
pH: 8 (ref 5.0–8.0)

## 2016-08-31 LAB — BASIC METABOLIC PANEL
Anion gap: 6 (ref 5–15)
BUN: 16 mg/dL (ref 6–20)
CALCIUM: 9 mg/dL (ref 8.9–10.3)
CO2: 26 mmol/L (ref 22–32)
CREATININE: 0.75 mg/dL (ref 0.44–1.00)
Chloride: 108 mmol/L (ref 101–111)
GFR calc Af Amer: >60 mL/min (ref >60)
GLUCOSE: 91 mg/dL (ref 65–99)
Potassium: 3.7 mmol/L (ref 3.5–5.1)
Sodium: 140 mmol/L (ref 135–145)

## 2016-08-31 LAB — CBC
HEMATOCRIT: 37.1 % (ref 35.0–47.0)
Hemoglobin: 12.3 g/dL (ref 12.0–16.0)
MCH: 28.9 pg (ref 26.0–34.0)
MCHC: 33.3 g/dL (ref 32.0–36.0)
MCV: 87 fL (ref 80.0–100.0)
PLATELETS: 180 10*3/uL (ref 150–440)
RBC: 4.26 MIL/uL (ref 3.80–5.20)
RDW: 15.1 % — AB (ref 11.5–14.5)
WBC: 7.2 10*3/uL (ref 3.6–11.0)

## 2016-08-31 LAB — TROPONIN I: Troponin I: <0.03 ng/mL (ref <0.03)

## 2016-08-31 NOTE — ED Notes (Signed)
Pt woke easily to take vitals. Returned to sleep when done. NAD. Respirations unlabored.

## 2016-08-31 NOTE — ED Notes (Signed)
Pt assisted back to bed x2. Pt moved to 19hallway bed to enable rn to watch pt more closely.

## 2016-08-31 NOTE — ED Notes (Signed)
[facility] here to take pt back to [facility].  Report was called by night shift RN

## 2016-08-31 NOTE — ED Provider Notes (Signed)
[facility] Emergency Department Provider Note   ____________________________________________   First MD Initiated Contact with Patient 08/31/16 [phone]     (approximate)  I have reviewed the triage vital signs and the nursing notes.   HISTORY  Chief Complaint Fall  Patient with dementia and nonverbal  HPI Nancy Montgomery is a 83 y.o. female who was brought in from her nursing home, at [facility]. The patient does have a history of dementia. The patient is nonverbal. According to EMS the patient was found on the floor [facility]. There was blood all over her face and her hands. The patient has a small abrasion to her forehead I am unable to assess any of the patient's other complaints as she is nonverbal. The patient will be evaluated in the emergency department.   Past Medical History:  Diagnosis Date  . Hypertension     Patient Active Problem List   Diagnosis Date Noted  . Acute delirium 12/30/2015  . Metabolic encephalopathy 12/30/2015    Past Surgical History:  Procedure Laterality Date  . NO PAST SURGERIES      Prior to Admission medications   Medication Sig Start Date End Date Taking? Authorizing Provider  amLODipine (NORVASC) 5 MG tablet Take 5 mg by mouth daily.   Yes Historical Provider, MD  aspirin 81 MG tablet Take 81 mg by mouth daily.   Yes Historical Provider, MD  Calcium Carb-Cholecalciferol 600-800 MG-UNIT CHEW Chew 1 tablet by mouth daily.   Yes Historical Provider, MD  donepezil (ARICEPT) 5 MG tablet Take 5 mg by mouth at bedtime.   Yes Historical Provider, MD  loperamide (IMODIUM) 1 MG/5ML solution Take 2 mg by mouth as needed for diarrhea or loose stools.   Yes Historical Provider, MD  magnesium hydroxide (MILK OF MAGNESIA) 400 MG/5ML suspension Take 30 mLs by mouth daily as needed for mild constipation.   Yes Historical Provider, MD  ondansetron (ZOFRAN-ODT) 4 MG disintegrating tablet Take 4 mg by mouth every 8 (eight)  hours as needed for nausea or vomiting.   Yes Historical Provider, MD  traZODone (DESYREL) 50 MG tablet Take 50 mg by mouth at bedtime as needed for sleep.   Yes Historical Provider, MD    Allergies Patient has no known allergies.  Family History  Problem Relation Age of Onset  . Hypertension Other     Social History Social History  Substance Use Topics  . Smoking status: Never Smoker  . Smokeless tobacco: Never Used  . Alcohol use Not on file    Review of Systems  Unable to obtain as patient nonverbal and unable to answer questions ____________________________________________   PHYSICAL EXAM:  VITAL SIGNS: ED Triage Vitals  Enc Vitals Group     BP 08/31/16 0237 (!) 153/75     Pulse Rate 08/31/16 0237 70     Resp 08/31/16 0237 12     Temp 08/31/16 0239 97.5 F (36.4 C)     Temp Source 08/31/16 0237 Axillary     SpO2 08/31/16 0237 96 %     Weight 08/31/16 0237 130 lb (59 kg)     Height --      Head Circumference --      Peak Flow --      Pain Score --      Pain Loc --      Pain Edu? --      Excl. in GC? --     Constitutional: Alert and nonverbal. Well appearing  and in no acute distress. Eyes: Conjunctivae are normal. PERRL. EOMI. Head: Atraumatic. Nose: No congestion/rhinnorhea. Mouth/Throat: Mucous membranes are moist.  Oropharynx non-erythematous. Neck: No cervical spine tenderness to palpation. Cardiovascular: Normal rate, regular rhythm. Grossly normal heart sounds.  Good peripheral circulation. Respiratory: Normal respiratory effort.  No retractions. Lungs CTAB. Gastrointestinal: Soft and nontender. No distention. Positive bowel sounds Musculoskeletal: No lower extremity tenderness nor edema.   Neurologic: Patient non verbal, dementia at baseline Skin:  Skin is warm, dry and intact.  Psychiatric: Mood and affect are normal. .  ____________________________________________   LABS (all labs ordered are listed, but only abnormal results are  displayed)  Labs Reviewed  CBC - Abnormal; Notable for the following:       Result Value   RDW 15.1 (*)    All other components within normal limits  URINALYSIS, COMPLETE (UACMP) WITH MICROSCOPIC - Abnormal; Notable for the following:    Color, Urine COLORLESS (*)    APPearance CLEAR (*)    Specific Gravity, Urine 1.004 (*)    Squamous Epithelial / LPF 0-5 (*)    All other components within normal limits  BASIC METABOLIC PANEL  TROPONIN I   ____________________________________________  EKG  ED ECG REPORT I, Rebecka Apley, the attending physician, personally viewed and interpreted this ECG.   Date: 08/31/2016  EKG Time: 247  Rate: 72  Rhythm: normal sinus rhythm  Axis: normal  Intervals:none  ST&T Change: none  ____________________________________________  RADIOLOGY  CT head and cervical spine ____________________________________________   PROCEDURES  Procedure(s) performed: None  Procedures  Critical Care performed: No  ____________________________________________   INITIAL IMPRESSION / ASSESSMENT AND PLAN / ED COURSE  Pertinent labs & imaging results that were available during my care of the patient were reviewed by me and considered in my medical decision making (see chart for details).  This is an 83 year old female who comes into the hospital today after an unwitnessed fall. She was found on the floor. The patient is not verbal at baseline. She is unable to answer any of my questions. I will check some blood work as the patient has not been here for some time and I will perform a CT of her head and cervical spine.  Clinical Course as of Sep 01 543  Sun Aug 31, 2016  0425 1. No evidence of traumatic intracranial injury or fracture. 2. No evidence of fracture or subluxation along the cervical spine. 3. Soft tissue swelling overlying the left frontal calvarium, with associated laceration. 4. Moderately severe cortical volume loss and scattered  small vessel ischemic microangiopathy. 5. Mild degenerative change along the mid cervical spine. 6. **An incidental finding of potential clinical significance has been found. 1.7 cm mildly heterogeneous right thyroid nodule noted. Consider further evaluation with thyroid ultrasound. If patient is clinically hyperthyroid, consider nuclear medicine thyroid uptake and scan.**   CT Head Wo Contrast [AW]    Clinical Course User Index [AW] Rebecka Apley, MD   The patient's workup is unremarkable. Her abrasions are small and do not need to be sutured. The patient will be discharged to follow-up with her primary care physician.  ____________________________________________   FINAL CLINICAL IMPRESSION(S) / ED DIAGNOSES  Final diagnoses:  Fall, initial encounter  Injury of head, initial encounter  Contusion of scalp, initial encounter      NEW MEDICATIONS STARTED DURING THIS VISIT:  New Prescriptions   No medications on file     Note:  This document was prepared using Dragon  voice recognition software and may include unintentional dictation errors.    Rebecka Apley, MD 08/31/16 [phone]

## 2016-08-31 NOTE — ED Notes (Signed)
Report to valerie, rn.  

## 2016-08-31 NOTE — ED Notes (Signed)
Report to [facility].

## 2016-08-31 NOTE — ED Notes (Signed)
Patient transported to CT 

## 2016-08-31 NOTE — ED Notes (Signed)
Pt with small less than 1mm abrasion to left mid parietal scalp. Pt does not wince or show discomfort when hips palpated, arms palpated or legs palpated. Pt does not show discomfort when neck palpated. Skin normal color warm and dry. No other injury obvious with exception of scalp abrasion. perrl 3mm and brisk.

## 2016-08-31 NOTE — ED Triage Notes (Signed)
Pt from [facility] with unwitnessed fall. Pt is non verbal history of dementia. Pt with abrasion with controlled bleeding to left parietal scalp.

## 2016-08-31 NOTE — ED Notes (Signed)
Pt in hallway, looking around in no acute distress. Family updated on progression of care.

## 2016-10-13 ENCOUNTER — Emergency Department: Payer: Medicare (Managed Care)

## 2016-10-13 ENCOUNTER — Emergency Department
Admission: EM | Admit: 2016-10-13 | Discharge: 2016-10-13 | Disposition: A | Discharge status: Home / Self Care | Payer: Medicare (Managed Care) | Attending: Emergency Medicine | Admitting: Emergency Medicine

## 2016-10-13 ENCOUNTER — Encounter: Payer: Self-pay | Admitting: Emergency Medicine

## 2016-10-13 DIAGNOSIS — I10 Essential (primary) hypertension: Secondary | ICD-10-CM | POA: Insufficient documentation

## 2016-10-13 DIAGNOSIS — F0390 Unspecified dementia without behavioral disturbance: Secondary | ICD-10-CM | POA: Insufficient documentation

## 2016-10-13 DIAGNOSIS — S0990XA Unspecified injury of head, initial encounter: Secondary | ICD-10-CM | POA: Diagnosis present

## 2016-10-13 DIAGNOSIS — Z79899 Other long term (current) drug therapy: Secondary | ICD-10-CM | POA: Diagnosis not present

## 2016-10-13 DIAGNOSIS — S0083XA Contusion of other part of head, initial encounter: Secondary | ICD-10-CM | POA: Insufficient documentation

## 2016-10-13 DIAGNOSIS — Z0000 Encounter for general adult medical examination without abnormal findings: Secondary | ICD-10-CM | POA: Insufficient documentation

## 2016-10-13 DIAGNOSIS — Y9389 Activity, other specified: Secondary | ICD-10-CM | POA: Diagnosis not present

## 2016-10-13 DIAGNOSIS — Z7982 Long term (current) use of aspirin: Secondary | ICD-10-CM | POA: Diagnosis not present

## 2016-10-13 DIAGNOSIS — Y92129 Unspecified place in nursing home as the place of occurrence of the external cause: Secondary | ICD-10-CM | POA: Insufficient documentation

## 2016-10-13 DIAGNOSIS — E041 Nontoxic single thyroid nodule: Secondary | ICD-10-CM | POA: Insufficient documentation

## 2016-10-13 DIAGNOSIS — W06XXXA Fall from bed, initial encounter: Secondary | ICD-10-CM | POA: Insufficient documentation

## 2016-10-13 DIAGNOSIS — Y999 Unspecified external cause status: Secondary | ICD-10-CM | POA: Insufficient documentation

## 2016-10-13 DIAGNOSIS — W19XXXA Unspecified fall, initial encounter: Secondary | ICD-10-CM

## 2016-10-13 LAB — CBC WITH DIFFERENTIAL/PLATELET
BASOS PCT: 1 %
Basophils Absolute: 0 10*3/uL (ref 0–0.1)
Basophils Absolute: 0 10*3/uL (ref 0–0.1)
Basophils Relative: 0 %
EOS ABS: 0.1 10*3/uL (ref 0–0.7)
Eosinophils Absolute: 0.2 10*3/uL (ref 0–0.7)
Eosinophils Relative: 3 %
Eosinophils Relative: 1 %
HCT: 37.8 % (ref 35.0–47.0)
HEMATOCRIT: 38.1 % (ref 35.0–47.0)
Hemoglobin: 12.9 g/dL (ref 12.0–16.0)
Hemoglobin: 12.5 g/dL (ref 12.0–16.0)
LYMPHS ABS: 1.3 10*3/uL (ref 1.0–3.6)
LYMPHS ABS: 1.1 10*3/uL (ref 1.0–3.6)
LYMPHS PCT: 21 %
Lymphocytes Relative: 17 %
MCH: 29.6 pg (ref 26.0–34.0)
MCH: 28.7 pg (ref 26.0–34.0)
MCHC: 33.7 g/dL (ref 32.0–36.0)
MCHC: 33.2 g/dL (ref 32.0–36.0)
MCV: 87.8 fL (ref 80.0–100.0)
MCV: 86.4 fL (ref 80.0–100.0)
MONO ABS: 0.7 10*3/uL (ref 0.2–0.9)
MONOS PCT: 12 %
MONOS PCT: 8 %
Monocytes Absolute: 0.5 10*3/uL (ref 0.2–0.9)
NEUTROS ABS: 4.1 10*3/uL (ref 1.4–6.5)
NEUTROS PCT: 63 %
Neutro Abs: 4.5 10*3/uL (ref 1.4–6.5)
Neutrophils Relative %: 74 %
Platelets: 168 10*3/uL (ref 150–440)
Platelets: 167 10*3/uL (ref 150–440)
RBC: 4.34 MIL/uL (ref 3.80–5.20)
RBC: 4.38 MIL/uL (ref 3.80–5.20)
RDW: 14.2 % (ref 11.5–14.5)
RDW: 14 % (ref 11.5–14.5)
WBC: 6.4 10*3/uL (ref 3.6–11.0)
WBC: 6.1 10*3/uL (ref 3.6–11.0)

## 2016-10-13 LAB — BASIC METABOLIC PANEL
Anion gap: 6 (ref 5–15)
Anion gap: 5 (ref 5–15)
BUN: 14 mg/dL (ref 6–20)
BUN: 12 mg/dL (ref 6–20)
CALCIUM: 8.7 mg/dL — AB (ref 8.9–10.3)
CHLORIDE: 108 mmol/L (ref 101–111)
CO2: 25 mmol/L (ref 22–32)
CO2: 24 mmol/L (ref 22–32)
CREATININE: 0.75 mg/dL (ref 0.44–1.00)
CREATININE: 0.85 mg/dL (ref 0.44–1.00)
Calcium: 8 mg/dL — ABNORMAL LOW (ref 8.9–10.3)
Chloride: 111 mmol/L (ref 101–111)
GFR calc Af Amer: >60 mL/min (ref >60)
GFR calc non Af Amer: >60 mL/min (ref >60)
GLUCOSE: 85 mg/dL (ref 65–99)
GLUCOSE: 94 mg/dL (ref 65–99)
Potassium: 3.6 mmol/L (ref 3.5–5.1)
Potassium: 3.4 mmol/L — ABNORMAL LOW (ref 3.5–5.1)
Sodium: 139 mmol/L (ref 135–145)
Sodium: 140 mmol/L (ref 135–145)

## 2016-10-13 LAB — URINALYSIS, COMPLETE (UACMP) WITH MICROSCOPIC
BILIRUBIN URINE: NEGATIVE
Glucose, UA: NEGATIVE mg/dL
HGB URINE DIPSTICK: NEGATIVE
Ketones, ur: NEGATIVE mg/dL
LEUKOCYTES UA: NEGATIVE
NITRITE: NEGATIVE
PROTEIN: NEGATIVE mg/dL
RBC / HPF: NONE SEEN RBC/hpf (ref 0–5)
Specific Gravity, Urine: 1.005 (ref 1.005–1.030)
pH: 7 (ref 5.0–8.0)

## 2016-10-13 LAB — TROPONIN I

## 2016-10-13 LAB — T4, FREE: Free T4: 0.96 ng/dL (ref 0.61–1.12)

## 2016-10-13 LAB — TSH: TSH: 3.943 u[IU]/mL (ref 0.350–4.500)

## 2016-10-13 NOTE — Discharge Instructions (Signed)
Lab work looks normal patient has is at her normal mental status per family return as needed.

## 2016-10-13 NOTE — ED Provider Notes (Signed)
[facility] Emergency Department Provider Note   ____________________________________________   First MD Initiated Contact with Patient 10/13/16 [phone]     (approximate)  I have reviewed the triage vital signs and the nursing notes.   HISTORY  Chief Complaint Pale per SNF  History limited by patient being nonverbal  HPI Nancy Montgomery is a 83 y.o. female patient had been found on the floor and was thought to have rolled out of bed according to [facility]. He had a hematoma on her head. She was evaluated in the emergency room and discharged earlier this morning. She was sent back to nursing home and nursing staff there thought she was pale. She was sent back here. Patient's family members with her now. She appears to be in her normal state of health not pale and in her normal mental status.   Past Medical History:  Diagnosis Date  . Hypertension     Patient Active Problem List   Diagnosis Date Noted  . Acute delirium 12/30/2015  . Metabolic encephalopathy 12/30/2015    Past Surgical History:  Procedure Laterality Date  . NO PAST SURGERIES      Prior to Admission medications   Medication Sig Start Date End Date Taking? Authorizing Provider  amLODipine (NORVASC) 5 MG tablet Take 5 mg by mouth daily.    [provider]  aspirin 81 MG tablet Take 81 mg by mouth daily.    [provider]  Calcium Carb-Cholecalciferol 600-800 MG-UNIT CHEW Chew 1 tablet by mouth daily.    [provider]  donepezil (ARICEPT) 5 MG tablet Take 5 mg by mouth at bedtime.    [provider]  loperamide (IMODIUM) 1 MG/5ML solution Take 2 mg by mouth as needed for diarrhea or loose stools.    [provider]  magnesium hydroxide (MILK OF MAGNESIA) 400 MG/5ML suspension Take 30 mLs by mouth daily as needed for mild constipation.    [provider]  ondansetron (ZOFRAN-ODT) 4 MG disintegrating tablet Take 4 mg by mouth every  8 (eight) hours as needed for nausea or vomiting.    [provider]  traZODone (DESYREL) 50 MG tablet Take 50 mg by mouth at bedtime as needed for sleep.    [provider]    Allergies Patient has no known allergies.  Family History  Problem Relation Age of Onset  . Hypertension Other     Social History Social History  Substance Use Topics  . Smoking status: Never Smoker  . Smokeless tobacco: Never Used  . Alcohol use Not on file    Review of Systems  Unable to obtain as patient is nonverbal ____________________________________________   PHYSICAL EXAM:  VITAL SIGNS: ED Triage Vitals [10/13/16 0910]  Enc Vitals Group     BP (!) 145/81     Pulse Rate 74     Resp 18     Temp 97.9 F (36.6 C)     Temp src      SpO2 100 %     Weight 132 lb (59.9 kg)     Height 5\' 6"  (1.676 m)     Head Circumference      Peak Flow      Pain Score      Pain Loc      Pain Edu?      Excl. in GC?     Constitutional: Alert  Well appearing and in no acute distress. Eyes: Conjunctivae are normal. PERRL. EOMI. Head: AtraumaticExcept  for previously described bruise on the right forehead. Nose: No congestion/rhinnorhea. Mouth/Throat: Mucous membranes are moist.  Oropharynx non-erythematous. Neck: No stridor.  Cardiovascular: Normal rate, regular rhythm. Grossly normal heart sounds.  Good peripheral circulation. Respiratory: Normal respiratory effort.  No retractions. Lungs CTAB. Gastrointestinal: Soft and nontender. No distention. No abdominal bruits. No CVA tenderness. Musculoskeletal: No lower extremity tenderness nor edema.  No joint effusions. Neurologic: No gross focal neurologic deficits are appreciated.  Skin:  Skin is warm, dry and intact. No rash noted. Psychiatric: Mood and affect are normal.  behavior are normal. Family member reports patient is at her normal mental status. Patient is no longer vomiting. Patient ate a cup of applesauce here did  well ____________________________________________   LABS (all labs ordered are listed, but only abnormal results are displayed)  Labs Reviewed  BASIC METABOLIC PANEL - Abnormal; Notable for the following:       Result Value   Potassium 3.4 (*)    Calcium 8.7 (*)    All other components within normal limits  CBC WITH DIFFERENTIAL/PLATELET  TROPONIN I   ____________________________________________  EKG   ____________________________________________  RADIOLOGY  ____________________________________________   PROCEDURES  Procedure(s) performed:  Procedures  Critical Care performed:   ____________________________________________   INITIAL IMPRESSION / ASSESSMENT AND PLAN / ED COURSE  Pertinent labs & imaging results that were available during my care of the patient were reviewed by me and considered in my medical decision making (see chart for details).  Patient is doing well is at baseline per family is tolerating by mouth and not vomiting we will discharge      ____________________________________________   FINAL CLINICAL IMPRESSION(S) / ED DIAGNOSES  Final diagnoses:  Normal physical examination      NEW MEDICATIONS STARTED DURING THIS VISIT:  New Prescriptions   No medications on file     Note:  This document was prepared using Dragon voice recognition software and may include unintentional dictation errors.    Arnaldo Natal, MD 10/13/16 1125

## 2016-10-13 NOTE — Discharge Instructions (Signed)
You were seen in the emergency department after a fall. Luckily all of your imaging studies did not show any evidence of injuries. Follow-up with you doctor within the next 2-3 days for further evaluation. Sometimes injuries can present at a later time and therefore it is imperative that you return to the emergency room if you have a severe headache, facial droop, neck pain, numbness or weakness of your extremities, slurred speech, difficulty finding words, chest pain, back pain, abdominal pain, or any other new symptoms that were not present during this visit. You may take Tylenol at home for your pain.   Your CT showed a nodule in your thyroid that needs to be evaluated by your doctor to rule out cancer. Make sure to follow up with your doctor as soon as possible.

## 2016-10-13 NOTE — ED Notes (Signed)
Report given to [facility]. They state transporter will come pick up patient as soon as he returns from previous trip. Patient and family made aware.

## 2016-10-13 NOTE — ED Triage Notes (Signed)
Patient from [facility] via [facility]. Patient was seen here last night for fall with knot on forehead. Per staff, upon arrival back to facility patient appeared more pale than normal. Also states patient had one episode of vomiting. Patient non-verbal at baseline. No distress noted at this time. Patient smiling and interacting with ED staff appropriately.

## 2016-10-13 NOTE — ED Provider Notes (Signed)
[facility] Emergency Department Provider Note  ____________________________________________  Time seen: Approximately 2:07 AM  I have reviewed the triage vital signs and the nursing notes.   HISTORY  Chief Complaint Fall  Level 5 caveat:  Portions of the history and physical were unable to be obtained due to dementia and nonverbal   HPI Nancy Montgomery is a 83 y.o. female with a history of dementiaand hypertension who presents for evaluation of right forehead hematoma. Patient coming from [facility]. According to staff they believe patient rolled out of bed and she was found on the floor next to her bed with a hematoma on the right forehead. Patient is nonverbal at baseline and unable to provide any history. The fall was unwitnessed.  Past Medical History:  Diagnosis Date  . Hypertension     Patient Active Problem List   Diagnosis Date Noted  . Acute delirium 12/30/2015  . Metabolic encephalopathy 12/30/2015    Past Surgical History:  Procedure Laterality Date  . NO PAST SURGERIES      Prior to Admission medications   Medication Sig Start Date End Date Taking? Authorizing Provider  amLODipine (NORVASC) 5 MG tablet Take 5 mg by mouth daily.    [provider]  aspirin 81 MG tablet Take 81 mg by mouth daily.    [provider]  Calcium Carb-Cholecalciferol 600-800 MG-UNIT CHEW Chew 1 tablet by mouth daily.    [provider]  donepezil (ARICEPT) 5 MG tablet Take 5 mg by mouth at bedtime.    [provider]  loperamide (IMODIUM) 1 MG/5ML solution Take 2 mg by mouth as needed for diarrhea or loose stools.    [provider]  magnesium hydroxide (MILK OF MAGNESIA) 400 MG/5ML suspension Take 30 mLs by mouth daily as needed for mild constipation.    [provider]  ondansetron (ZOFRAN-ODT) 4 MG disintegrating tablet Take 4 mg by mouth every 8 (eight) hours as needed for nausea or vomiting.     [provider]  traZODone (DESYREL) 50 MG tablet Take 50 mg by mouth at bedtime as needed for sleep.    [provider]    Allergies Patient has no known allergies.  Family History  Problem Relation Age of Onset  . Hypertension Other     Social History Social History  Substance Use Topics  . Smoking status: Never Smoker  . Smokeless tobacco: Never Used  . Alcohol use Not on file    Review of Systems  + R forehead hematoma  Level 5 caveat:  Portions of the history and physical were unable to be obtained due to dementia and nonverbal  ____________________________________________   PHYSICAL EXAM:  VITAL SIGNS: ED Triage Vitals  Enc Vitals Group     BP 10/13/16 0046 (!) 151/101     Pulse Rate 10/13/16 0046 76     Resp 10/13/16 0046 16     Temp 10/13/16 0046 98.4 F (36.9 C)     Temp Source 10/13/16 0046 Axillary     SpO2 10/13/16 0046 97 %     Weight 10/13/16 0044 132 lb (59.9 kg)     Height --      Head Circumference --      Peak Flow --      Pain Score --      Pain Loc --      Pain Edu? --      Excl. in GC? --     Constitutional: Awake,  good eye contact, does not answer questions, follows commands. HEENT Head: Normocephalic, large R forehead hematoma. Face: No facial bony tenderness. Stable midface Ears: No hemotympanum bilaterally. No Battle sign Eyes: No eye injury. PERRL. No raccoon eyes Nose: Nontender. No epistaxis. No rhinorrhea Mouth/Throat: Mucous membranes are moist. No oropharyngeal blood. No dental injury. Airway patent without stridor. Normal voice. Neck: no C-collar in place. No midline c-spine tenderness.  Cardiovascular: Normal rate, regular rhythm. Normal and symmetric distal pulses are present in all extremities. Pulmonary/Chest: Chest wall is stable and nontender to palpation/compression. Normal respiratory effort. Breath sounds are normal. No crepitus.  Abdominal: Soft, nontender, non distended. Musculoskeletal:  Nontender with normal full range of motion in all extremities. No deformities. No thoracic or lumbar midline spinal tenderness. Pelvis is stable. Skin: Skin is warm, dry and intact. No abrasions or contutions. Neurological: Moving all extremities, face is symmetric  Glascow Coma Score: 4 - Opens eyes on own 6 - Follows simple motor commands 1 - Makes no noise GCS: 11   ____________________________________________   LABS (all labs ordered are listed, but only abnormal results are displayed)  Labs Reviewed  BASIC METABOLIC PANEL - Abnormal; Notable for the following:       Result Value   Calcium 8.0 (*)    All other components within normal limits  URINALYSIS, COMPLETE (UACMP) WITH MICROSCOPIC - Abnormal; Notable for the following:    Color, Urine STRAW (*)    APPearance CLEAR (*)    Bacteria, UA RARE (*)    Squamous Epithelial / LPF 0-5 (*)    All other components within normal limits  URINE CULTURE  CBC WITH DIFFERENTIAL/PLATELET  TROPONIN I  TSH  T4, FREE   ____________________________________________  EKG  ED ECG REPORT I, Nita Sickle, the attending physician, personally viewed and interpreted this ECG.  Normal sinus rhythm, rate of 72, first-degree AV block, normal QRS and QTc intervals, normal axis, no ST elevations or depressions. ____________________________________________  RADIOLOGY  CT head and cspine:  1. No evidence of traumatic intracranial injury or fracture. 2. No evidence of fracture or subluxation along the cervical spine. 3. Soft tissue swelling overlying the right frontal calvarium. 4. Moderately severe cortical volume loss and scattered small vessel ischemic microangiopathy. 5. Mild degenerative change along the cervical spine. 6. **An incidental finding of potential clinical significance has been found. 1.7 cm heterogeneous nodule noted arising at the right thyroid lobe. Consider further evaluation with thyroid ultrasound. If patient is  clinically hyperthyroid, consider nuclear medicine thyroid uptake and scan.** ____________________________________________   PROCEDURES  Procedure(s) performed: None Procedures Critical Care performed:  None ____________________________________________   INITIAL IMPRESSION / ASSESSMENT AND PLAN / ED COURSE   83 y.o. female with a history of dementiaand hypertension who presents for evaluation of right forehead hematoma after being found on the floor next to her bed at [facility]. Patient is nonverbal at baseline.She is well appearing with good eye contact, following commands. She has a large right-sided forehead hematoma. Vitals are within normal limits. No other acute findings on exam. This patient has dementia with an unwitnessed fall will check basic blood work and urinalysis to rule out dehydration, electrolyte abnormalities, or infection is possible causes of her fall. We'll do CT head and C-spine. We'll monitor on telemetry per    _________________________ 3:51 AM on 10/13/2016 -----------------------------------------  Labs, urine, and imaging with no acute findings. Patient remains at her baseline. CT with an incidental finding of a thyroid nodule. TSH and free T4  are within normal limits. Mia, RN at [facility] and [facility] who was at the bedside were informed of this finding and the recommendation of follow up to rule out malignancy. This recommendation was also documented on patient's discharge instructions  Pertinent labs & imaging results that were available during my care of the patient were reviewed by me and considered in my medical decision making (see chart for details).    ____________________________________________   FINAL CLINICAL IMPRESSION(S) / ED DIAGNOSES  Final diagnoses:  Fall, initial encounter  Traumatic hematoma of forehead, initial encounter  Thyroid nodule      NEW MEDICATIONS STARTED DURING THIS VISIT:  New Prescriptions   No  medications on file     Note:  This document was prepared using Dragon voice recognition software and may include unintentional dictation errors.    Don PerkingVeronese, [place], MD 10/13/16 [phone]

## 2016-10-13 NOTE — ED Notes (Signed)
Large hematoma and swelling noted to right side of forehead. Bleeding under control.

## 2016-10-13 NOTE — ED Triage Notes (Signed)
pt arrived to ED from [facility]. Pt rolled out of bed this evening. Datff reports pt is acting at baseline but has knot on head. unknown if pt lost conciousness. pt is nonverbal at baseline. no deformities noted.

## 2016-10-15 LAB — URINE CULTURE

## 2016-10-16 NOTE — Progress Notes (Signed)
ED Antimicrobial Stewardship Positive Culture Follow Up   Nancy Montgomery is an 83 y.o. female who presented to [facility] on 10/13/2016 with a chief complaint of  Chief Complaint  Patient presents with  . Pale per SNF    Recent Results (from the past 720 hour(s))  Urine culture     Status: Abnormal   Collection Time: 10/13/16  1:16 AM  Result Value Ref Range Status   Specimen Description URINE, RANDOM  Final   Special Requests NONE  Final   Culture (A)  Final    30,000 COLONIES/mL VANCOMYCIN RESISTANT ENTEROCOCCUS   Report Status 10/15/2016 FINAL  Final   Organism ID, Bacteria VANCOMYCIN RESISTANT ENTEROCOCCUS (A)  Final      Susceptibility   Vancomycin resistant enterococcus - MIC*    AMPICILLIN 8 SENSITIVE Sensitive     LEVOFLOXACIN >=8 RESISTANT Resistant     NITROFURANTOIN 128 RESISTANT Resistant     VANCOMYCIN >=32 RESISTANT Resistant     * 30,000 COLONIES/mL VANCOMYCIN RESISTANT ENTEROCOCCUS    Patient was discharged to [facility]. Spoke with staff there and obtained fax number. Culture results faxed to [facility] MD at [phone].  Cindi CarbonMary M Houston Surges, PharmD 10/16/16 1:26 PM

## 2016-11-13 ENCOUNTER — Emergency Department
Admission: EM | Admit: 2016-11-13 | Discharge: 2016-11-14 | Disposition: A | Discharge status: Skilled Nursing Facility | Payer: Medicare (Managed Care) | Attending: Emergency Medicine | Admitting: Emergency Medicine

## 2016-11-13 DIAGNOSIS — Z79899 Other long term (current) drug therapy: Secondary | ICD-10-CM | POA: Diagnosis not present

## 2016-11-13 DIAGNOSIS — I10 Essential (primary) hypertension: Secondary | ICD-10-CM | POA: Insufficient documentation

## 2016-11-13 DIAGNOSIS — R220 Localized swelling, mass and lump, head: Secondary | ICD-10-CM | POA: Diagnosis present

## 2016-11-13 DIAGNOSIS — T783XXA Angioneurotic edema, initial encounter: Secondary | ICD-10-CM | POA: Diagnosis not present

## 2016-11-13 MED ORDER — DIPHENHYDRAMINE HCL 50 MG/ML IJ SOLN
25.0000 mg | INTRAMUSCULAR | Status: DC | PRN
  Administered 2016-11-13: 25 mg via INTRAVENOUS
  Filled 2016-11-13: qty 1

## 2016-11-13 MED ORDER — FAMOTIDINE IN NACL 20-0.9 MG/50ML-% IV SOLN
20.0000 mg | Freq: Once | INTRAVENOUS | Status: AC
  Administered 2016-11-13: 20 mg via INTRAVENOUS
  Filled 2016-11-13: qty 50

## 2016-11-13 MED ORDER — DIPHENHYDRAMINE HCL 50 MG/ML IJ SOLN: 25.0000 mg | INTRAMUSCULAR | Status: DC | PRN

## 2016-11-13 MED ORDER — PREDNISONE 20 MG PO TABS: 60.0000 mg | ORAL_TABLET | Freq: Every day | ORAL | 0 refills | Status: DC

## 2016-11-13 MED ORDER — FAMOTIDINE 20 MG PO TABS: 20.0000 mg | ORAL_TABLET | Freq: Every day | ORAL | 1 refills | Status: DC

## 2016-11-13 MED ORDER — METHYLPREDNISOLONE SODIUM SUCC 125 MG IJ SOLR
125.0000 mg | Freq: Once | INTRAMUSCULAR | Status: AC
  Administered 2016-11-13: 125 mg via INTRAVENOUS
  Filled 2016-11-13: qty 2

## 2016-11-13 NOTE — ED Triage Notes (Signed)
Pt bib EMS from [facility] w/ c/o swelling to lip that began after dinner. Per EMS, pt has NKA. Pt has hx of dementia, per EMS facility [place] Resp even and unlabored.  Pt appears in no pain, does not answer questions reliably.  Per EMS, pt ambulatory to stretcher w/o issue. No medications given by EMS or facility. NAD. Skin warm and dry.

## 2016-11-13 NOTE — ED Notes (Signed)
Patient will not leave pulse ox on.

## 2016-11-13 NOTE — Discharge Instructions (Signed)
If she has increased swelling or trouble breathing call 911 and return to the emergency department

## 2016-11-13 NOTE — ED Provider Notes (Addendum)
[facility] Emergency Department Provider Note  ____________________________________________   I have reviewed the triage vital signs and the nursing notes.   HISTORY  Chief Complaint Oral Swelling    HPI Nancy Montgomery is a 83 y.o. female presents today complaining of nothing. Patient has significant dementia. She has however lip swelling. Unclear how long it has been there. She cannot give me any history. Is no evidence of other swelling and she has no complaints. She is at her baseline according to EMS. Level 5 chart caveat; no further history available due to patient status. That this is not includes losartan or or Ace inhibitors  Past Medical History:  Diagnosis Date  . Hypertension     Patient Active Problem List   Diagnosis Date Noted  . Acute delirium 12/30/2015  . Metabolic encephalopathy 12/30/2015    Past Surgical History:  Procedure Laterality Date  . NO PAST SURGERIES      Prior to Admission medications   Medication Sig Start Date End Date Taking? Authorizing Provider  acetaminophen (TYLENOL) 325 MG tablet Take 650 mg by mouth every 6 (six) hours as needed.   Yes [provider]  acetaminophen (TYLENOL) 650 MG suppository Place 650 mg rectally every 6 (six) hours as needed.   Yes [provider]  amLODipine (NORVASC) 5 MG tablet Take 5 mg by mouth daily.   Yes [provider]  aspirin 81 MG tablet Take 81 mg by mouth daily.   Yes [provider]  dextromethorphan-guaiFENesin (MUCINEX DM) 30-600 MG 12hr tablet Take 1 tablet by mouth 2 (two) times daily as needed for cough.   Yes [provider]  ergocalciferol (VITAMIN D2) 50000 units capsule Take 50,000 Units by mouth every 30 (thirty) days.   Yes [provider]  loperamide (IMODIUM) 1 MG/5ML solution Take 2 mg by mouth as needed for diarrhea or loose stools.   Yes [provider]  magnesium hydroxide (MILK OF MAGNESIA) 400  MG/5ML suspension Take 30 mLs by mouth daily as needed for mild constipation.   Yes [provider]  ondansetron (ZOFRAN-ODT) 4 MG disintegrating tablet Take 4 mg by mouth every 8 (eight) hours as needed for nausea or vomiting.   Yes [provider]  traZODone (DESYREL) 50 MG tablet Take 25 mg by mouth at bedtime as needed for sleep.    Yes [provider]  ZINC OXIDE, TOPICAL, 10 % CREA Apply 1 application topically daily as needed.   Yes [provider]    Allergies Patient has no known allergies.  Family History  Problem Relation Age of Onset  . Hypertension Other     Social History Social History  Substance Use Topics  . Smoking status: Never Smoker  . Smokeless tobacco: Never Used  . Alcohol use Not on file    Review of Systems Level 5 chart caveat; no further history available due to patient status.   ____________________________________________   PHYSICAL EXAM:  VITAL SIGNS: ED Triage Vitals  Enc Vitals Group     BP 11/13/16 1956 (!) 141/82     Pulse Rate 11/13/16 1956 81     Resp 11/13/16 1956 18     Temp 11/13/16 1952 98.3 F (36.8 C)     Temp Source 11/13/16 1952 Oral     SpO2 11/13/16 1956 99 %     Weight 11/13/16 1952 130 lb (59 kg)     Height --      Head Circumference --  Peak Flow --      Pain Score --      Pain Loc --      Pain Edu? --      Excl. in GC? --     Constitutional: Alert and In no acute distress doesn't really follow commands pleasantly demented. Well appearing and in no acute distress. Eyes: Conjunctivae are normal Head: Atraumatic HEENT: No congestion/rhinnorhea. Mucous membranes are moist.  Oropharynx non-erythematous, poor visualization of time because patient closes her mouth tight every time I try to look in there but there is no evidence that I can see of any tongue swelling she has no stridor, she has what appears to be isolated angioedema of the lower lip Neck:   Nontender with no  meningismus, no masses, no stridor Cardiovascular: Normal rate, regular rhythm. Grossly normal heart sounds.  Good peripheral circulation. Respiratory: Normal respiratory effort.  No retractions. Lungs CTAB. Musculoskeletal: No lower extremity tenderness, no upper extremity tenderness. No joint effusions, no DVT signs strong distal pulses no edema Neurologic:  Normal speech and language. No gross focal neurologic deficits are appreciated.  Skin:  Skin is warm, dry and intact. No rash noted. Psychiatric: Calm and collected in no acute distress  ____________________________________________   LABS (all labs ordered are listed, but only abnormal results are displayed)  Labs Reviewed - No data to display ____________________________________________  EKG  I personally interpreted any EKGs ordered by me or triage  ____________________________________________  RADIOLOGY  I reviewed any imaging ordered by me or triage that were performed during my shift and, if possible, patient and/or family made aware of any abnormal findings. ____________________________________________   PROCEDURES  Procedure(s) performed: None  Procedures  Critical Care performed: None  ____________________________________________   INITIAL IMPRESSION / ASSESSMENT AND PLAN / ED COURSE  Pertinent labs & imaging results that were available during my care of the patient were reviewed by me and considered in my medical decision making (see chart for details).  Patient with what appears to be a slight angioedema of the lower lip we will treat her with the usual IV medications and observe her. No obvious inciting medications noted.  ----------------------------------------- 9:11 PM on 11/13/2016 -----------------------------------------  May be slightly better certainly no worse, family at bedside, she is at her baseline and they state. We will continue to  observe.  ----------------------------------------- 11:32 PM on 11/13/2016 -----------------------------------------  Lip swelling is markedly improved, able to look into her mouth and I see no evidence of times like she has no stridor she has no notes of throat swelling, she is nearly back to baseline. I did discuss with the family whether they wish further observation there adamantly would like to go home. They do understand that at this point, there is always a chance of worsening symptoms. They are very comfortable however taking her home and that is their request. We will discharge her with close outpatient follow-up and return precautions.     ____________________________________________   FINAL CLINICAL IMPRESSION(S) / ED DIAGNOSES  Final diagnoses:  None      This chart was dictated using voice recognition software.  Despite best efforts to proofread,  errors can occur which can change meaning.      Jeanmarie Plant, MD 11/13/16 Babette Relic    Jeanmarie Plant, MD 11/13/16 2112    Jeanmarie Plant, MD 11/13/16 [phone]

## 2016-12-02 ENCOUNTER — Inpatient Hospital Stay
Admission: EM | Admit: 2016-12-02 | Discharge: 2016-12-05 | DRG: 101 | Disposition: A | Discharge status: Skilled Nursing Facility | Payer: Medicare (Managed Care) | Attending: Internal Medicine | Admitting: Internal Medicine

## 2016-12-02 ENCOUNTER — Emergency Department: Payer: Medicare (Managed Care)

## 2016-12-02 ENCOUNTER — Inpatient Hospital Stay: Payer: Medicare (Managed Care)

## 2016-12-02 ENCOUNTER — Encounter: Payer: Self-pay | Admitting: Emergency Medicine

## 2016-12-02 DIAGNOSIS — N3000 Acute cystitis without hematuria: Secondary | ICD-10-CM | POA: Diagnosis present

## 2016-12-02 DIAGNOSIS — I10 Essential (primary) hypertension: Secondary | ICD-10-CM | POA: Diagnosis present

## 2016-12-02 DIAGNOSIS — Z66 Do not resuscitate: Secondary | ICD-10-CM | POA: Diagnosis present

## 2016-12-02 DIAGNOSIS — R569 Unspecified convulsions: Principal | ICD-10-CM

## 2016-12-02 DIAGNOSIS — R7303 Prediabetes: Secondary | ICD-10-CM | POA: Diagnosis present

## 2016-12-02 DIAGNOSIS — F0280 Dementia in other diseases classified elsewhere without behavioral disturbance: Secondary | ICD-10-CM | POA: Diagnosis present

## 2016-12-02 DIAGNOSIS — G309 Alzheimer's disease, unspecified: Secondary | ICD-10-CM | POA: Diagnosis present

## 2016-12-02 DIAGNOSIS — K5790 Diverticulosis of intestine, part unspecified, without perforation or abscess without bleeding: Secondary | ICD-10-CM | POA: Diagnosis present

## 2016-12-02 DIAGNOSIS — Z7982 Long term (current) use of aspirin: Secondary | ICD-10-CM

## 2016-12-02 DIAGNOSIS — B952 Enterococcus as the cause of diseases classified elsewhere: Secondary | ICD-10-CM | POA: Diagnosis present

## 2016-12-02 HISTORY — DX: Alzheimer's disease, unspecified: G30.9

## 2016-12-02 HISTORY — DX: Prediabetes: R73.03

## 2016-12-02 HISTORY — DX: Disorder of kidney and ureter, unspecified: N28.9

## 2016-12-02 HISTORY — DX: Diverticulosis of intestine, part unspecified, without perforation or abscess without bleeding: K57.90

## 2016-12-02 HISTORY — DX: Dementia in other diseases classified elsewhere, unspecified severity, without behavioral disturbance, psychotic disturbance, mood disturbance, and anxiety: F02.80

## 2016-12-02 LAB — URINALYSIS, COMPLETE (UACMP) WITH MICROSCOPIC
Bilirubin Urine: NEGATIVE
GLUCOSE, UA: NEGATIVE mg/dL
Hgb urine dipstick: NEGATIVE
KETONES UR: NEGATIVE mg/dL
Leukocytes, UA: NEGATIVE
NITRITE: NEGATIVE
PH: 6 (ref 5.0–8.0)
Protein, ur: NEGATIVE mg/dL
RBC / HPF: NONE SEEN RBC/hpf (ref 0–5)
Specific Gravity, Urine: 1.012 (ref 1.005–1.030)

## 2016-12-02 LAB — CBC WITH DIFFERENTIAL/PLATELET
Basophils Absolute: 0.1 10*3/uL (ref 0–0.1)
Basophils Relative: 1 %
EOS ABS: 0 10*3/uL (ref 0–0.7)
EOS PCT: 0 %
HCT: 40.2 % (ref 35.0–47.0)
Hemoglobin: 13.2 g/dL (ref 12.0–16.0)
LYMPHS ABS: 2 10*3/uL (ref 1.0–3.6)
Lymphocytes Relative: 16 %
MCH: 28.5 pg (ref 26.0–34.0)
MCHC: 32.9 g/dL (ref 32.0–36.0)
MCV: 86.7 fL (ref 80.0–100.0)
MONO ABS: 1.1 10*3/uL — AB (ref 0.2–0.9)
MONOS PCT: 9 %
Neutro Abs: 9.4 10*3/uL — ABNORMAL HIGH (ref 1.4–6.5)
Neutrophils Relative %: 74 %
PLATELETS: 153 10*3/uL (ref 150–440)
RBC: 4.63 MIL/uL (ref 3.80–5.20)
RDW: 13.6 % (ref 11.5–14.5)
WBC: 12.7 10*3/uL — ABNORMAL HIGH (ref 3.6–11.0)

## 2016-12-02 LAB — COMPREHENSIVE METABOLIC PANEL
ALK PHOS: 68 U/L (ref 38–126)
ALT: 11 U/L — ABNORMAL LOW (ref 14–54)
ANION GAP: 10 (ref 5–15)
AST: 30 U/L (ref 15–41)
Albumin: 3.5 g/dL (ref 3.5–5.0)
BUN: 13 mg/dL (ref 6–20)
CALCIUM: 8.8 mg/dL — AB (ref 8.9–10.3)
CO2: 22 mmol/L (ref 22–32)
Chloride: 107 mmol/L (ref 101–111)
Creatinine, Ser: 1.05 mg/dL — ABNORMAL HIGH (ref 0.44–1.00)
GFR calc non Af Amer: 47 mL/min — ABNORMAL LOW (ref >60)
GFR, EST AFRICAN AMERICAN: 55 mL/min — AB (ref >60)
Glucose, Bld: 112 mg/dL — ABNORMAL HIGH (ref 65–99)
POTASSIUM: 4.2 mmol/L (ref 3.5–5.1)
SODIUM: 139 mmol/L (ref 135–145)
Total Bilirubin: 0.9 mg/dL (ref 0.3–1.2)
Total Protein: 7.7 g/dL (ref 6.5–8.1)

## 2016-12-02 LAB — URINE DRUG SCREEN, QUALITATIVE (ARMC ONLY)
Amphetamines, Ur Screen: NOT DETECTED
BARBITURATES, UR SCREEN: NOT DETECTED
Benzodiazepine, Ur Scrn: NOT DETECTED
COCAINE METABOLITE, UR ~~LOC~~: NOT DETECTED
Cannabinoid 50 Ng, Ur ~~LOC~~: NOT DETECTED
MDMA (Ecstasy)Ur Screen: NOT DETECTED
METHADONE SCREEN, URINE: NOT DETECTED
Opiate, Ur Screen: NOT DETECTED
Phencyclidine (PCP) Ur S: NOT DETECTED
TRICYCLIC, UR SCREEN: NOT DETECTED

## 2016-12-02 LAB — ETHANOL: Alcohol, Ethyl (B): <5 mg/dL (ref <5)

## 2016-12-02 LAB — MRSA PCR SCREENING: MRSA by PCR: NEGATIVE

## 2016-12-02 LAB — LACTIC ACID, PLASMA: Lactic Acid, Venous: 4.6 mmol/L (ref 0.5–1.9)

## 2016-12-02 MED ORDER — LORAZEPAM 2 MG/ML IJ SOLN
1.0000 mg | INTRAMUSCULAR | Status: DC | PRN
  Filled 2016-12-02: qty 1

## 2016-12-02 MED ORDER — ONDANSETRON 4 MG PO TBDP: 4.0000 mg | ORAL_TABLET | Freq: Three times a day (TID) | ORAL | Status: DC | PRN

## 2016-12-02 MED ORDER — VITAMIN D (ERGOCALCIFEROL) 1.25 MG (50000 UNIT) PO CAPS: 50000.0000 [IU] | ORAL_CAPSULE | ORAL | Status: DC

## 2016-12-02 MED ORDER — FAMOTIDINE 20 MG PO TABS: 20.0000 mg | ORAL_TABLET | Freq: Every day | ORAL | Status: DC

## 2016-12-02 MED ORDER — LORAZEPAM 2 MG/ML IJ SOLN
1.0000 mg | INTRAMUSCULAR | Status: DC | PRN
  Administered 2016-12-02: 1 mg via INTRAVENOUS

## 2016-12-02 MED ORDER — TRAZODONE HCL 50 MG PO TABS: 25.0000 mg | ORAL_TABLET | Freq: Every evening | ORAL | Status: DC | PRN

## 2016-12-02 MED ORDER — LORAZEPAM 2 MG/ML IJ SOLN
0.5000 mg | Freq: Once | INTRAMUSCULAR | Status: AC
  Administered 2016-12-02: 0.5 mg via INTRAVENOUS
  Filled 2016-12-02: qty 1

## 2016-12-02 MED ORDER — AMLODIPINE BESYLATE 5 MG PO TABS
5.0000 mg | ORAL_TABLET | Freq: Every day | ORAL | Status: DC
  Administered 2016-12-02 – 2016-12-05 (×4): 5 mg via ORAL
  Filled 2016-12-02 (×4): qty 1

## 2016-12-02 MED ORDER — LOPERAMIDE HCL 1 MG/5ML PO LIQD
2.0000 mg | ORAL | Status: DC | PRN
  Filled 2016-12-02: qty 10

## 2016-12-02 MED ORDER — GADOBENATE DIMEGLUMINE 529 MG/ML IV SOLN
10.0000 mL | Freq: Once | INTRAVENOUS | Status: AC | PRN
  Administered 2016-12-02: 10 mL via INTRAVENOUS

## 2016-12-02 MED ORDER — ACETAMINOPHEN 650 MG RE SUPP: 650.0000 mg | Freq: Four times a day (QID) | RECTAL | Status: DC | PRN

## 2016-12-02 MED ORDER — SODIUM CHLORIDE 0.9 % IV SOLN
500.0000 mg | Freq: Two times a day (BID) | INTRAVENOUS | Status: DC
  Administered 2016-12-02 – 2016-12-03 (×3): 500 mg via INTRAVENOUS
  Filled 2016-12-02 (×5): qty 5

## 2016-12-02 MED ORDER — ACETAMINOPHEN 325 MG PO TABS: 650.0000 mg | ORAL_TABLET | Freq: Four times a day (QID) | ORAL | Status: DC | PRN

## 2016-12-02 MED ORDER — ENOXAPARIN SODIUM 40 MG/0.4ML ~~LOC~~ SOLN
40.0000 mg | SUBCUTANEOUS | Status: DC
  Administered 2016-12-02 – 2016-12-04 (×3): 40 mg via SUBCUTANEOUS
  Filled 2016-12-02 (×2): qty 0.4

## 2016-12-02 MED ORDER — ASPIRIN EC 81 MG PO TBEC
81.0000 mg | DELAYED_RELEASE_TABLET | Freq: Every day | ORAL | Status: DC
  Administered 2016-12-03 – 2016-12-05 (×3): 81 mg via ORAL
  Filled 2016-12-02 (×4): qty 1

## 2016-12-02 MED ORDER — DEXTROSE 5 % IV SOLN
1.0000 g | Freq: Once | INTRAVENOUS | Status: AC
  Administered 2016-12-02: 1 g via INTRAVENOUS
  Filled 2016-12-02: qty 10

## 2016-12-02 MED ORDER — SODIUM CHLORIDE 0.9 % IV SOLN
INTRAVENOUS | Status: DC
  Administered 2016-12-02 (×3): via INTRAVENOUS

## 2016-12-02 MED ORDER — LEVETIRACETAM 500 MG PO TABS: 500.0000 mg | ORAL_TABLET | Freq: Two times a day (BID) | ORAL | Status: DC

## 2016-12-02 NOTE — ED Notes (Signed)
Date and time results received: 12/02/16 9:55 AM (use smartphrase ".now" to insert current time)  Test: Lactic Acid Critical Value: 4.6  Name of Provider Notified: Dr. Don PerkingVeronese  Orders Received? Or Actions Taken?: No new orders.

## 2016-12-02 NOTE — Progress Notes (Signed)
RN notified prime doctor about pt.'s diet. Verbal order to change pt.'s diet to dysphagia 2. Also notified prime doctor that pt.'s lactic acid at 0909 12/02/2016 was 4.6 with no recheck, no new orders given at this time. Will continue to monitor pt.   Hermelinda Diegel [place]

## 2016-12-02 NOTE — H&P (Signed)
Nancy Montgomery is an 83 y.o. female.   Chief Complaint: Seizure HPI: This 83 year old female is resident of a nursing home. She had a witnessed seizure and has been post ictal symptoms. She was given 1 dose of Ativan. Her niece is at the bedside. Says she's not back to her baseline. Says she does have severe dementia and doesn't respond to that many commands. The patient is arousable but not cooperative at this point.  Past Medical History:  Diagnosis Date  . Alzheimer disease   . Diverticulosis   . Hypertension   . Prediabetes   . Renal disorder     Past Surgical History:  Procedure Laterality Date  . NO PAST SURGERIES      Family History  Problem Relation Age of Onset  . Hypertension Other    Social History:  reports that she has never smoked. She has never used smokeless tobacco. She reports that she does not drink alcohol or use drugs.  Allergies: No Known Allergies   (Not in a hospital admission)  Results for orders placed or performed during the hospital encounter of 12/02/16 (from the past 48 hour(s))  CBC with Differential/Platelet     Status: Abnormal   Collection Time: 12/02/16  9:09 AM  Result Value Ref Range   WBC 12.7 (H) 3.6 - 11.0 K/uL   RBC 4.63 3.80 - 5.20 MIL/uL   Hemoglobin 13.2 12.0 - 16.0 g/dL   HCT 40.2 35.0 - 47.0 %   MCV 86.7 80.0 - 100.0 fL   MCH 28.5 26.0 - 34.0 pg   MCHC 32.9 32.0 - 36.0 g/dL   RDW 13.6 11.5 - 14.5 %   Platelets 153 150 - 440 K/uL   Neutrophils Relative % 74 %   Neutro Abs 9.4 (H) 1.4 - 6.5 K/uL   Lymphocytes Relative 16 %   Lymphs Abs 2.0 1.0 - 3.6 K/uL   Monocytes Relative 9 %   Monocytes Absolute 1.1 (H) 0.2 - 0.9 K/uL   Eosinophils Relative 0 %   Eosinophils Absolute 0.0 0 - 0.7 K/uL   Basophils Relative 1 %   Basophils Absolute 0.1 0 - 0.1 K/uL  Comprehensive metabolic panel     Status: Abnormal   Collection Time: 12/02/16  9:09 AM  Result Value Ref Range   Sodium 139 135 - 145 mmol/L   Potassium 4.2 3.5 - 5.1  mmol/L    Comment: HEMOLYSIS AT THIS LEVEL MAY AFFECT RESULT   Chloride 107 101 - 111 mmol/L   CO2 22 22 - 32 mmol/L   Glucose, Bld 112 (H) 65 - 99 mg/dL   BUN 13 6 - 20 mg/dL   Creatinine, Ser 1.05 (H) 0.44 - 1.00 mg/dL   Calcium 8.8 (L) 8.9 - 10.3 mg/dL   Total Protein 7.7 6.5 - 8.1 g/dL   Albumin 3.5 3.5 - 5.0 g/dL   AST 30 15 - 41 U/L   ALT 11 (L) 14 - 54 U/L   Alkaline Phosphatase 68 38 - 126 U/L   Total Bilirubin 0.9 0.3 - 1.2 mg/dL   GFR calc non Af Amer 47 (L) >60 mL/min   GFR calc Af Amer 55 (L) >60 mL/min    Comment: (NOTE) The eGFR has been calculated using the CKD EPI equation. This calculation has not been validated in all clinical situations. eGFR's persistently <60 mL/min signify possible Chronic Kidney Disease.    Anion gap 10 5 - 15  Ethanol     Status: None  Collection Time: 12/02/16  9:09 AM  Result Value Ref Range   Alcohol, Ethyl (B) <5 <5 mg/dL    Comment:        LOWEST DETECTABLE LIMIT FOR SERUM ALCOHOL IS 5 mg/dL FOR MEDICAL PURPOSES ONLY   Urine Drug Screen, Qualitative ([facility] only)     Status: None   Collection Time: 12/02/16  9:09 AM  Result Value Ref Range   Tricyclic, Ur Screen NONE DETECTED NONE DETECTED   Amphetamines, Ur Screen NONE DETECTED NONE DETECTED   MDMA (Ecstasy)Ur Screen NONE DETECTED NONE DETECTED   Cocaine Metabolite,Ur [place] NONE DETECTED NONE DETECTED   Opiate, Ur Screen NONE DETECTED NONE DETECTED   Phencyclidine (PCP) Ur S NONE DETECTED NONE DETECTED   Cannabinoid 50 Ng, Ur [place] NONE DETECTED NONE DETECTED   Barbiturates, Ur Screen NONE DETECTED NONE DETECTED   Benzodiazepine, Ur Scrn NONE DETECTED NONE DETECTED   Methadone Scn, Ur NONE DETECTED NONE DETECTED    Comment: (NOTE) 671  Tricyclics, urine               Cutoff 1000 ng/mL 200  Amphetamines, urine             Cutoff 1000 ng/mL 300  MDMA (Ecstasy), urine           Cutoff 500 ng/mL 400  Cocaine Metabolite, urine       Cutoff 300 ng/mL 500  Opiate, urine                    Cutoff 300 ng/mL 600  Phencyclidine (PCP), urine      Cutoff 25 ng/mL 700  Cannabinoid, urine              Cutoff 50 ng/mL 800  Barbiturates, urine             Cutoff 200 ng/mL 900  Benzodiazepine, urine           Cutoff 200 ng/mL 1000 Methadone, urine                Cutoff 300 ng/mL 1100 1200 The urine drug screen provides only a preliminary, unconfirmed 1300 analytical test result and should not be used for non-medical 1400 purposes. Clinical consideration and professional judgment should 1500 be applied to any positive drug screen result due to possible 1600 interfering substances. A more specific alternate chemical method 1700 must be used in order to obtain a confirmed analytical result.  1800 Gas chromato graphy / mass spectrometry (GC/MS) is the preferred 1900 confirmatory method.   Lactic acid, plasma     Status: Abnormal   Collection Time: 12/02/16  9:09 AM  Result Value Ref Range   Lactic Acid, Venous 4.6 (HH) 0.5 - 1.9 mmol/L    Comment: CRITICAL RESULT CALLED TO, READ BACK BY AND VERIFIED WITH STEPHEN JONES@0950  ON 12/02/16 BY HKP   Urinalysis, Complete w Microscopic     Status: Abnormal   Collection Time: 12/02/16  9:09 AM  Result Value Ref Range   Color, Urine YELLOW (A) YELLOW   APPearance CLEAR (A) CLEAR   Specific Gravity, Urine 1.012 1.005 - 1.030   pH 6.0 5.0 - 8.0   Glucose, UA NEGATIVE NEGATIVE mg/dL   Hgb urine dipstick NEGATIVE NEGATIVE   Bilirubin Urine NEGATIVE NEGATIVE   Ketones, ur NEGATIVE NEGATIVE mg/dL   Protein, ur NEGATIVE NEGATIVE mg/dL   Nitrite NEGATIVE NEGATIVE   Leukocytes, UA NEGATIVE NEGATIVE   RBC / HPF NONE SEEN 0 - 5 RBC/hpf  WBC, UA 0-5 0 - 5 WBC/hpf   Bacteria, UA RARE (A) NONE SEEN   Squamous Epithelial / LPF 0-5 (A) NONE SEEN   Mucous PRESENT    Hyaline Casts, UA PRESENT    Ct Head Wo Contrast  Result Date: 12/02/2016 CLINICAL DATA:  Altered mental status with questionable seizure. EXAM: CT HEAD WITHOUT CONTRAST  TECHNIQUE: Contiguous axial images were obtained from the base of the skull through the vertex without intravenous contrast. COMPARISON:  Oct 13, 2016 FINDINGS: Brain: There is moderate diffuse atrophy. There is no intracranial mass, hemorrhage, extra-axial fluid collection, or midline shift. There is patchy small vessel disease in the centra semiovale bilaterally. Elsewhere gray-white compartments appear normal. No evident acute infarct. Vascular: No appreciable hyperdense vessel. There is no appreciable vascular calcification. Skull: Bony calvarium appears intact. Sinuses/Orbits: There is mild mucosal thickening in several ethmoid air cells. Other visualized paranasal sinuses are clear. Visualized orbits appear symmetric bilaterally. Other: Mastoid air cells are clear. IMPRESSION: Atrophy with patchy periventricular small vessel disease. No intracranial mass, hemorrhage, or extra-axial fluid collection. No acute infarct. Mild ethmoid sinus disease bilaterally. Electronically Signed   By: Lowella Grip III M.D.   On: 12/02/2016 10:09    Review of Systems  Unable to perform ROS: Dementia    Blood pressure 128/68, pulse 78, temperature 99.2 F (37.3 C), temperature source Oral, resp. rate 16, weight 59 kg (130 lb), SpO2 98 %. Physical Exam  Constitutional:  Well-nourished female who is mildly postictal  HENT:  Head: Normocephalic and atraumatic.  Mouth/Throat: Oropharynx is clear and moist. No oropharyngeal exudate.  Eyes: Pupils are equal, round, and reactive to light. No scleral icterus.  Neck: Neck supple. No JVD present. No thyromegaly present.  Cardiovascular: Normal rate and regular rhythm.   No murmur heard. Respiratory: Breath sounds normal. No respiratory distress. She has no wheezes. She has no rales. She exhibits no tenderness.  GI: She exhibits no distension.  Musculoskeletal: She exhibits no edema.  Lymphadenopathy:    She has no cervical adenopathy.  Neurological:  Somewhat  somnolent from being postictal. Moves all extremities. Pupils are equal round reactive to light. Does not cooperate with commands.  Skin: Skin is warm and dry.     Assessment/Plan 1. New onset seizure. Patient has no history of seizures before. She had a witnessed 1 earlier today. CT scan of the brain does not show any abnormalities. We'll go ahead and put her on Keppra. We'll go ahead and get an MRI to rule out malignant disease or stroke. At this point with advanced dementia would not pursue any further workup as it was still result in her being on medication. 2. Hypertension. Continue her current medications. 3. Dementia. Her niece who is present and said she does have advanced dementia to the point she does respond that many commands. Next line total time spent 45 minutes  Baxter Hire, MD 12/02/2016, 12:54 PM

## 2016-12-02 NOTE — Progress Notes (Signed)
Pt arrived to room 218. VSS. Pt is nonverbal not responding to commands. According POA, that is pt.'s baseline. neurologic assessment was performed. Seizure pads and bed alarm was placed on the pt.'s bed. Will continue to monitor pt.   Nancy Montgomery

## 2016-12-02 NOTE — ED Triage Notes (Signed)
Pt to ED via EMS from [facility] c/o seizure like activity today.  EMS vitals: CBG 127, 99% RA, 103 HR.  EMS states no hx of seizures or stroke, per staff normally alert and talking.  Pt presents alert, not speaking, gaze to the right, and trying to get out of bed.

## 2016-12-02 NOTE — ED Notes (Signed)
Pt had medium BM and had voided - pt turned, dried, and changed - pt also given applesauce to eat

## 2016-12-02 NOTE — ED Provider Notes (Signed)
[facility] Emergency Department Provider Note  ____________________________________________  Time seen: Approximately 9:21 AM  I have reviewed the triage vital signs and the nursing notes.   HISTORY  Chief Complaint Seizures  Level 5 caveat:  Portions of the history and physical were unable to be obtained due to AMS and dementia   HPI Nancy Montgomery is a 83 y.o. female with a history of Alzheimer's, hypertension, and prediabetes who [facility] for new onset of seizure. History is gathered mostly from nursing staff [facility]. [facility] to [facility] the nurse who witnessed the episode, patient was sitting on the table at the lunch call having breakfast. The nurses started hearing a gurgling sound and thought the patient was choking. When she came near the patient she noticed the patient started seizing, she was incontinent of stool. No prior history of seizure disorder. No recent illness including fever, vomiting, diarrhea. Patient usually doesn't talk much at baseline but is ambulatory and pretty independent according to nursing staff. She has never had a stroke and has no neurological deficits otherwise.Patient does not answer any questions, seems very confused and disoriented, and has preferred gaze to the right although will look to the left when stimulated. She is moving all of her extremities spontaneously but does not follow any commands. BG normal per EMS  Past Medical History:  Diagnosis Date  . Alzheimer disease   . Diverticulosis   . Hypertension   . Prediabetes   . Renal disorder     Patient Active Problem List   Diagnosis Date Noted  . Seizure (HCC) 12/02/2016  . Acute delirium 12/30/2015  . Metabolic encephalopathy 12/30/2015    Past Surgical History:  Procedure Laterality Date  . NO PAST SURGERIES      Prior to Admission medications   Medication Sig Start Date End Date Taking? Authorizing Provider  amLODipine  (NORVASC) 5 MG tablet Take 5 mg by mouth daily.   Yes [provider]  promethazine (PHENERGAN) 12.5 MG suppository Place 12.5 mg rectally every 6 (six) hours as needed for nausea or vomiting.   Yes [provider]  acetaminophen (TYLENOL) 325 MG tablet Take 650 mg by mouth every 6 (six) hours as needed.    [provider]  acetaminophen (TYLENOL) 650 MG suppository Place 650 mg rectally every 6 (six) hours as needed.    [provider]  aspirin 81 MG tablet Take 81 mg by mouth daily.    [provider]  dextromethorphan-guaiFENesin (MUCINEX DM) 30-600 MG 12hr tablet Take 1 tablet by mouth 2 (two) times daily as needed for cough.    [provider]  ergocalciferol (VITAMIN D2) 50000 units capsule Take 50,000 Units by mouth every 30 (thirty) days.    [provider]  famotidine (PEPCID) 20 MG tablet Take 1 tablet (20 mg total) by mouth daily. Patient not taking: Reported on 12/02/2016 11/13/16 11/13/17  Jeanmarie PlantMcShane, James A, MD  loperamide (IMODIUM) 1 MG/5ML solution Take 2 mg by mouth as needed for diarrhea or loose stools.    [provider]  magnesium hydroxide (MILK OF MAGNESIA) 400 MG/5ML suspension Take 30 mLs by mouth daily as needed for mild constipation.    [provider]  ondansetron (ZOFRAN-ODT) 4 MG disintegrating tablet Take 4 mg by mouth every 8 (eight) hours as needed for nausea or vomiting.    [provider]  predniSONE (DELTASONE) 20 MG tablet Take 3 tablets (60 mg total) by mouth daily.  Patient not taking: Reported on 12/02/2016 11/13/16   Jeanmarie Plant, MD  traZODone (DESYREL) 50 MG tablet Take 25 mg by mouth at bedtime as needed for sleep.     [provider]    Allergies Patient has no known allergies.  Family History  Problem Relation Age of Onset  . Hypertension Other     Social History Social History  Substance Use Topics  . Smoking status: Never Smoker  . Smokeless  tobacco: Never Used  . Alcohol use No    Review of Systems Level 5 caveat:  Portions of the history and physical were unable to be obtained due to AMS and dementia  Constitutional: Negative for fever. Respiratory: Negative for cough. Gastrointestinal: Negative for vomiting or diarrhea.  ____________________________________________   PHYSICAL EXAM:  VITAL SIGNS: ED Triage Vitals [12/02/16 0902]  Enc Vitals Group     BP (!) 175/90     Pulse Rate (!) 103     Resp 18     Temp 99.2 F (37.3 C)     Temp Source Oral     SpO2 98 %     Weight 130 lb (59 kg)     Height      Head Circumference      [place] Flow      Pain Score      Pain Loc      Pain Edu?      Excl. in GC?     Constitutional: Alert, non verbal, seems confused, no distress.  HEENT:      Head: Normocephalic and atraumatic.         Eyes: Conjunctivae are normal. Sclera is non-icteric.       Mouth/Throat: Mucous membranes are moist.       Neck: Supple with no signs of meningismus. Cardiovascular: Tachycardic with regular rhythm. No murmurs, gallops, or rubs. Respiratory: Normal respiratory effort. Lungs are clear to auscultation bilaterally. No wheezes, crackles, or rhonchi.  Gastrointestinal: Soft, non tender, and non distended with positive bowel sounds. No rebound or guarding. Musculoskeletal: Nontender with normal range of motion in all extremities. No edema, cyanosis, or erythema of extremities. Neurologic: Non verbal, gaze preference to the R although will look to the left if stimulated, moving all extremities spontaneously however not following commands, face is symmetric.  Skin: Skin is warm, dry and intact. No rash noted.  ____________________________________________   LABS (all labs ordered are listed, but only abnormal results are displayed)  Labs Reviewed  CBC WITH DIFFERENTIAL/PLATELET - Abnormal; Notable for the following:       Result Value   WBC 12.7 (*)    Neutro Abs 9.4 (*)    Monocytes  Absolute 1.1 (*)    All other components within normal limits  COMPREHENSIVE METABOLIC PANEL - Abnormal; Notable for the following:    Glucose, Bld 112 (*)    Creatinine, Ser 1.05 (*)    Calcium 8.8 (*)    ALT 11 (*)    GFR calc non Af Amer 47 (*)    GFR calc Af Amer 55 (*)    All other components within normal limits  LACTIC ACID, PLASMA - Abnormal; Notable for the following:    Lactic Acid, Venous 4.6 (*)    All other components within normal limits  URINALYSIS, COMPLETE (UACMP) WITH MICROSCOPIC - Abnormal; Notable for the following:    Color, Urine YELLOW (*)    APPearance CLEAR (*)    Bacteria, UA RARE (*)    Squamous  Epithelial / LPF 0-5 (*)    All other components within normal limits  URINE CULTURE  ETHANOL  URINE DRUG SCREEN, QUALITATIVE ([facility] ONLY)   ____________________________________________  EKG  ED ECG REPORT I, Nita Sickle, the attending physician, personally viewed and interpreted this ECG.  Normal sinus rhythm, rate of 99, normal intervals, normal axis, no ST elevations or depressions.  ____________________________________________  RADIOLOGY  Head Ct: negative  ____________________________________________   PROCEDURES  Procedure(s) performed: None Procedures Critical Care performed:  None ____________________________________________   INITIAL IMPRESSION / ASSESSMENT AND PLAN / ED COURSE  83 y.o. female with a history of Alzheimer's, hypertension, and prediabetes who presents from [place] [facility] for new onset of seizure. Patient is awake but does seem confused, not answering any questions, not following any commands, moves all 4 extremities spontaneously, gaze preference to the right. No signs of trauma. We'll check a head CT to rule out intracranial bleed or mass. We'll get blood work to rule out electrolyte abnormalities such as hyponatremia or hypercalcemia. Will check UA to rule out UTI. Seizure precautions initiated. Will monitor on  telemetry.  Clinical Course as of Dec 02 1537  Tue Dec 02, 2016  1107 Lactate is elevated at 4.7 consistent with seizures. Patient has a mildly positive UA with mild leukocytosis therefore she was given ceftriaxone. Patient still not back to her baseline. Drug screen, remaining of her electrolytes, and head CT with no acute findings. Patient be admitted to the hospitalist service.  [CV]    Clinical Course User Index [CV] Nita Sickle, MD    Pertinent labs & imaging results that were available during my care of the patient were reviewed by me and considered in my medical decision making (see chart for details).    ____________________________________________   FINAL CLINICAL IMPRESSION(S) / ED DIAGNOSES  Final diagnoses:  Seizure (HCC)  Acute cystitis without hematuria      NEW MEDICATIONS STARTED DURING THIS VISIT:  New Prescriptions   No medications on file     Note:  This document was prepared using Dragon voice recognition software and may include unintentional dictation errors.    Don Perking, [place], MD 12/02/16 1539

## 2016-12-02 NOTE — ED Notes (Signed)
Primary care and family POA at bedside.  Patient more rested not attempting to get out of bed. Patient also looking and turning head left.

## 2016-12-03 ENCOUNTER — Inpatient Hospital Stay (HOSPITAL_COMMUNITY): Payer: Medicare (Managed Care)

## 2016-12-03 DIAGNOSIS — R569 Unspecified convulsions: Secondary | ICD-10-CM

## 2016-12-03 NOTE — Clinical Social Work Note (Signed)
Clinical Social Work Assessment  Patient Details  Name: Nancy Montgomery MRN: [id number] Date of Birth: 07/09/1930  Date of referral:  12/03/16               Reason for consult:  Discharge Planning                Permission sought to share information with:  Case Manager, Facility Medical sales representativeContact Representative, Family Supports Permission granted to share information::  Yes, Verbal Permission Granted  Name::        Agency::  [facility], memory care,  [facility]  Relationship::  Johny SaxCousin [place](POA,     Foye ClockSharetta [phone]  Contact Information:     Housing/Transportation Living arrangements for the past 2 months:  Assisted Living Facility Source of Information:  Medical Team, Case Manager, Facility, Other (Comment Required) (cousin) Patient Interpreter Needed:  None Criminal Activity/Legal Involvement Pertinent to Current Situation/Hospitalization:  No - Comment as needed Significant Relationships:  Other Family Members, Community Support Lives with:  Facility Resident Do you feel safe going back to the place where you live?    Need for family participation in patient care:  Yes (Comment) (POA memory care)  Care giving concerns:  Patient admitted to hospital from memory care unit at [facility].  Patient at baseline is nonverbal and confused. She wanders at the facility and is able to move around and get up.  Cousin reports her husband was at facility as well, but had to be moved to [facility] for long term care. [facility] is active with [facility] and goes to center on Thursday, where she gets to see her husband.   NO PT consult needed due to patient unable to follow commands and for the most part independent.     Social Worker assessment / plan:  LCSW completed assessment with cousin and [facility]. Plan; [facility] to [facility] for [facility] term [facility] when medically stable.  Will update FL2 once medically stable with discharge medications.  Family aware of plan and in  agreement.  Employment status:  Retired Health and safety inspectornsurance information:  Other (Comment Required) ([facility]) PT Recommendations:    Information / Referral to community resources:    none currently, updated [facility]  Patient/Family's Response to care:  Agreeable  Patient/Family's Understanding of and Emotional Response to Diagnosis, Current Treatment, and Prognosis:  Family voices understanding and plans. Updated on current status of patient and answered questions.  Emotional Assessment Appearance:  Appears stated age Attitude/Demeanor/Rapport:    Affect (typically observed):  Other, Pleasant (confused) Orientation:  Oriented to Self Alcohol / Substance use:  Not Applicable Psych involvement (Current and /or in the community):  No (Comment)  Discharge Needs  Concerns to be addressed:  No discharge needs identified Readmission within the last 30 days:  No Current discharge risk:  None Barriers to Discharge:  No Barriers Identified, Continued Medical Work up   Raye SorrowCoble, [place] Beachem N, LCSW 12/03/2016, 11:17 AM

## 2016-12-03 NOTE — Progress Notes (Signed)
[facility] - [place] at [facility]   PATIENT NAME: Nancy Montgomery    MR#:  [id number]  DATE OF BIRTH:  11/17/1930  SUBJECTIVE:is seen at bedside,had one seizure  At nursing home. No seizure since she is here. On Keppra. Patient nonverbal at baseline but according to family she is very mobile /   CHIEF COMPLAINT:   Chief Complaint  Patient presents with  . Seizures    REVIEW OF SYSTEMS:    Review of Systems  Unable to perform ROS: Patient nonverbal    Nutrition:  Tolerating Diet: Tolerating PT:      DRUG ALLERGIES:  No Known Allergies  VITALS:  Blood pressure (!) 121/98, pulse 67, temperature (!) 97.4 F (36.3 C), temperature source Oral, resp. rate 18, weight 59 kg (130 lb), SpO2 98 %.  PHYSICAL EXAMINATION:   Physical Exam  GENERAL:  83 y.o.-year-old patient lying in the bed with no acute distress.  EYES: Pupils equal, round, reactive to light and accommodation. No scleral icterus. Extraocular muscles intact.  HEENT: Head atraumatic, normocephalic. Oropharynx and nasopharynx clear.  NECK:  Supple, no jugular venous distention. No thyroid enlargement, no tenderness.  LUNGS: Normal breath sounds bilaterally, no wheezing, rales,rhonchi or crepitation. No use of accessory muscles of respiration.  CARDIOVASCULAR: S1, S2 normal. No murmurs, rubs, or gallops.  ABDOMEN: Soft, nontender, nondistended. Bowel sounds present. No organomegaly or mass.  EXTREMITIES: No pedal edema, cyanosis, or clubbing.  NEUROLOGIC: Nonverbal, unable to follow commands PSYCHIATRIC: The patient is alert and oriented x 3.  SKIN: No obvious rash, lesion, or ulcer.    LABORATORY PANEL:   CBC  Recent Labs Lab 12/02/16 0909  WBC 12.7*  HGB 13.2  HCT 40.2  PLT 153   ------------------------------------------------------------------------------------------------------------------  Chemistries   Recent Labs Lab 12/02/16 0909  NA 139  K 4.2  CL 107  CO2 22   GLUCOSE 112*  BUN 13  CREATININE 1.05*  CALCIUM 8.8*  AST 30  ALT 11*  ALKPHOS 68  BILITOT 0.9   ------------------------------------------------------------------------------------------------------------------  Cardiac Enzymes No results for input(s): TROPONINI in the last 168 hours. ------------------------------------------------------------------------------------------------------------------  RADIOLOGY:  Ct Head Wo Contrast  Result Date: 12/02/2016 CLINICAL DATA:  Altered mental status with questionable seizure. EXAM: CT HEAD WITHOUT CONTRAST TECHNIQUE: Contiguous axial images were obtained from the base of the skull through the vertex without intravenous contrast. COMPARISON:  Oct 13, 2016 FINDINGS: Brain: There is moderate diffuse atrophy. There is no intracranial mass, hemorrhage, extra-axial fluid collection, or midline shift. There is patchy small vessel disease in the centra semiovale bilaterally. Elsewhere gray-white compartments appear normal. No evident acute infarct. Vascular: No appreciable hyperdense vessel. There is no appreciable vascular calcification. Skull: Bony calvarium appears intact. Sinuses/Orbits: There is mild mucosal thickening in several ethmoid air cells. Other visualized paranasal sinuses are clear. Visualized orbits appear symmetric bilaterally. Other: Mastoid air cells are clear. IMPRESSION: Atrophy with patchy periventricular small vessel disease. No intracranial mass, hemorrhage, or extra-axial fluid collection. No acute infarct. Mild ethmoid sinus disease bilaterally. Electronically Signed   By: Bretta BangWilliam  Woodruff III M.D.   On: 12/02/2016 10:09   Mr Laqueta JeanBrain W JXWo Contrast  Result Date: 12/02/2016 CLINICAL DATA:  83 y/o  F; witnessed seizure.  History of dementia. EXAM: MRI HEAD WITHOUT AND WITH CONTRAST TECHNIQUE: Multiplanar, multiecho pulse sequences of the brain and surrounding structures were obtained without and with intravenous contrast. CONTRAST:   10mL MULTIHANCE GADOBENATE DIMEGLUMINE 529 MG/ML IV SOLN COMPARISON:  12/02/2016 CT of  the head. FINDINGS: Brain: Severe motion degradation. No acute infarction, hemorrhage, hydrocephalus, extra-axial collection, mass lesion, or abnormal enhancement identified. Chronic microvascular ischemic changes and advanced parenchymal volume loss of the brain. Vascular: Central flow voids are present. Skull and upper cervical spine: Normal marrow signal. Sinuses/Orbits: Negative. Other: None. IMPRESSION: 1. Severe motion degradation of all sequences. 2. No acute intracranial abnormality or abnormal enhancement identified. 3. Advanced brain parenchymal volume loss. Electronically Signed   By: Mitzi Hansen M.D.   On: 12/02/2016 19:22     ASSESSMENT AND PLAN:   Active Problems:   Seizure (HCC)   1. Seizure: EEG, neurology consult ordered, continue Keppra, continue seizure precautions.EEG  #2 possible sepsis: Elevated lactic acid, elevated white count. Patient received Rocephin but no fever, UA, chest x-ray are clear. Follow white count. #3. Dementia #4 essential hypertension  All the records are reviewed and case discussed with Care Management/Social Workerr. Management plans discussed with the patient, family and they are in agreement.  CODE STATUS:DNR  TOTAL TIME TAKING CARE OF THIS PATIENT: full minutes.   POSSIBLE D/C IN 1-2DAYS, DEPENDING ON CLINICAL CONDITION.   Katha Hamming M.D on 12/03/2016 at 2:01 PM  Between 7am to 6pm - Pager - [phone]  After 6pm go to [email or web address] - password [email or web address]  [place] [facility]  Office  [phone]  CC: Primary care physician; Precious Reel, NP

## 2016-12-04 DIAGNOSIS — R569 Unspecified convulsions: Principal | ICD-10-CM

## 2016-12-04 LAB — LACTIC ACID, PLASMA
LACTIC ACID, VENOUS: 1.3 mmol/L (ref 0.5–1.9)
Lactic Acid, Venous: 1 mmol/L (ref 0.5–1.9)

## 2016-12-04 LAB — CBC
HCT: 37.5 % (ref 35.0–47.0)
HEMOGLOBIN: 12.6 g/dL (ref 12.0–16.0)
MCH: 28.9 pg (ref 26.0–34.0)
MCHC: 33.6 g/dL (ref 32.0–36.0)
MCV: 86.1 fL (ref 80.0–100.0)
Platelets: 148 10*3/uL — ABNORMAL LOW (ref 150–440)
RBC: 4.35 MIL/uL (ref 3.80–5.20)
RDW: 13.8 % (ref 11.5–14.5)
WBC: 7 10*3/uL (ref 3.6–11.0)

## 2016-12-04 LAB — PHOSPHORUS: PHOSPHORUS: 3.7 mg/dL (ref 2.5–4.6)

## 2016-12-04 MED ORDER — LEVETIRACETAM 100 MG/ML PO SOLN
500.0000 mg | Freq: Two times a day (BID) | ORAL | Status: DC
  Administered 2016-12-04 – 2016-12-05 (×3): 500 mg via ORAL
  Filled 2016-12-04 (×4): qty 5

## 2016-12-04 MED ORDER — LEVETIRACETAM 500 MG PO TABS
500.0000 mg | ORAL_TABLET | Freq: Two times a day (BID) | ORAL | Status: DC
  Filled 2016-12-04: qty 1

## 2016-12-04 MED ORDER — LINEZOLID 100 MG/5ML PO SUSR
600.0000 mg | Freq: Two times a day (BID) | ORAL | Status: DC
  Administered 2016-12-04 – 2016-12-05 (×3): 600 mg via ORAL
  Filled 2016-12-04 (×4): qty 30

## 2016-12-04 NOTE — Progress Notes (Signed)
[facility] - [place] at [facility]   PATIENT NAME: Nancy Montgomery    MR#:  [id number]  DATE OF BIRTH:  01-15-1931  SUBJECTIVE: Patient is seen at bedside, nonverbal. EEG is done /procedures since admission.   CHIEF COMPLAINT:   Chief Complaint  Patient presents with  . Seizures    REVIEW OF SYSTEMS:    Review of Systems  Unable to perform ROS: Patient nonverbal    Nutrition:  Tolerating Diet: Tolerating PT:      DRUG ALLERGIES:  No Known Allergies  VITALS:  Blood pressure 130/67, pulse 64, temperature 97.6 F (36.4 C), temperature source Oral, resp. rate 18, height 5' (1.524 m), weight 59 kg (130 lb), SpO2 96 %.  PHYSICAL EXAMINATION:   Physical Exam  GENERAL:  83 y.o.-year-old patient lying in the bed with no acute distress.  EYES: Pupils equal, round, reactive to light and accommodation. No scleral icterus. Extraocular muscles intact.  HEENT: Head atraumatic, normocephalic. Oropharynx and nasopharynx clear.  NECK:  Supple, no jugular venous distention. No thyroid enlargement, no tenderness.  LUNGS: Normal breath sounds bilaterally, no wheezing, rales,rhonchi or crepitation. No use of accessory muscles of respiration.  CARDIOVASCULAR: S1, S2 normal. No murmurs, rubs, or gallops.  ABDOMEN: Soft, nontender, nondistended. Bowel sounds present. No organomegaly or mass.  EXTREMITIES: No pedal edema, cyanosis, or clubbing.  NEUROLOGIC: Nonverbal, unable to follow commands PSYCHIATRIC: The patient is alert and oriented x 3.  SKIN: No obvious rash, lesion, or ulcer.    LABORATORY PANEL:   CBC  Recent Labs Lab 12/02/16 0909  WBC 12.7*  HGB 13.2  HCT 40.2  PLT 153   ------------------------------------------------------------------------------------------------------------------  Chemistries   Recent Labs Lab 12/02/16 0909  NA 139  K 4.2  CL 107  CO2 22  GLUCOSE 112*  BUN 13  CREATININE 1.05*  CALCIUM 8.8*  AST 30  ALT 11*   ALKPHOS 68  BILITOT 0.9   ------------------------------------------------------------------------------------------------------------------  Cardiac Enzymes No results for input(s): TROPONINI in the last 168 hours. ------------------------------------------------------------------------------------------------------------------  RADIOLOGY:  Mr Laqueta Jean Wo Contrast  Result Date: 12/02/2016 CLINICAL DATA:  83 y/o  F; witnessed seizure.  History of dementia. EXAM: MRI HEAD WITHOUT AND WITH CONTRAST TECHNIQUE: Multiplanar, multiecho pulse sequences of the brain and surrounding structures were obtained without and with intravenous contrast. CONTRAST:  10mL MULTIHANCE GADOBENATE DIMEGLUMINE 529 MG/ML IV SOLN COMPARISON:  12/02/2016 CT of the head. FINDINGS: Brain: Severe motion degradation. No acute infarction, hemorrhage, hydrocephalus, extra-axial collection, mass lesion, or abnormal enhancement identified. Chronic microvascular ischemic changes and advanced parenchymal volume loss of the brain. Vascular: Central flow voids are present. Skull and upper cervical spine: Normal marrow signal. Sinuses/Orbits: Negative. Other: None. IMPRESSION: 1. Severe motion degradation of all sequences. 2. No acute intracranial abnormality or abnormal enhancement identified. 3. Advanced brain parenchymal volume loss. Electronically Signed   By: Mitzi Hansen M.D.   On: 12/02/2016 19:22     ASSESSMENT AND PLAN:   Active Problems:   Seizure (HCC)   1. Seizure:, changed to by mouth today. Check repeat mg,po4,s. Discharge likely tomorrow.  #2 possible sepsis: Elevated lactic acid, elevated white count. Patient received Rocephin but no fever, UA, chest x-ray are clear. Follow white count. Check repeat lactic acid.  #3. Dementia #4 essential hypertension  All the records are reviewed and case discussed with Care Management/Social Workerr. Management plans discussed with the patient, family and they  are in agreement.  CODE STATUS:DNR  TOTAL TIME TAKING CARE OF  THIS PATIENT: full minutes.   POSSIBLE D/C IN 1-2DAYS, DEPENDING ON CLINICAL CONDITION.   Katha HammingKONIDENA,Blaike Newburn M.D on 12/04/2016 at 11:05 AM  Between 7am to 6pm - Pager - [phone]  After 6pm go to [email or web address] - password [email or web address]  [facility]  Office  [phone]  CC: Primary care physician; Precious ReelBlake, Mary, NP

## 2016-12-04 NOTE — Consult Note (Signed)
Reason for Consult:Seizure Referring Physician: Luberta MutterKonidena  CC: Seizure  HPI: Nancy Montgomery is an 83 y.o. female with a history of advanced dementia who is unable to provide any history due to her dementia.  No family available at this time therefore all history obtained from the chart.  Patient had a witnessed seizure on 7/18 at her facility.  She was given 1mg  of Ativan and did not return to baseline mental status.  Patient admitted for further observation.    Past Medical History:  Diagnosis Date  . Alzheimer disease   . Diverticulosis   . Hypertension   . Prediabetes   . Renal disorder     Past Surgical History:  Procedure Laterality Date  . NO PAST SURGERIES      Family History  Problem Relation Age of Onset  . Hypertension Other     Social History:  reports that she has never smoked. She has never used smokeless tobacco. She reports that she does not drink alcohol or use drugs.  No Known Allergies  Medications:  I have reviewed the patient's current medications. Prior to Admission:  Prescriptions Prior to Admission  Medication Sig Dispense Refill Last Dose  . amLODipine (NORVASC) 5 MG tablet Take 5 mg by mouth daily.   12/01/2016 at 0800  . promethazine (PHENERGAN) 12.5 MG suppository Place 12.5 mg rectally every 6 (six) hours as needed for nausea or vomiting.   prn at prn  . acetaminophen (TYLENOL) 325 MG tablet Take 650 mg by mouth every 6 (six) hours as needed.   prn at prn  . acetaminophen (TYLENOL) 650 MG suppository Place 650 mg rectally every 6 (six) hours as needed.   prn at prn  . aspirin 81 MG tablet Take 81 mg by mouth daily.   uknown at unknown  . dextromethorphan-guaiFENesin (MUCINEX DM) 30-600 MG 12hr tablet Take 1 tablet by mouth 2 (two) times daily as needed for cough.   prn at prn  . ergocalciferol (VITAMIN D2) 50000 units capsule Take 50,000 Units by mouth every 30 (thirty) days.   11/16/2016 at 0800  . famotidine (PEPCID) 20 MG tablet Take 1 tablet (20 mg  total) by mouth daily. (Patient not taking: Reported on 12/02/2016) 30 tablet 1 Not Taking at Unknown time  . loperamide (IMODIUM) 1 MG/5ML solution Take 2 mg by mouth as needed for diarrhea or loose stools.   prn at prn  . magnesium hydroxide (MILK OF MAGNESIA) 400 MG/5ML suspension Take 30 mLs by mouth daily as needed for mild constipation.   prn at prn  . ondansetron (ZOFRAN-ODT) 4 MG disintegrating tablet Take 4 mg by mouth every 8 (eight) hours as needed for nausea or vomiting.   prn at prn  . predniSONE (DELTASONE) 20 MG tablet Take 3 tablets (60 mg total) by mouth daily. (Patient not taking: Reported on 12/02/2016) 12 tablet 0 Not Taking at Unknown time  . traZODone (DESYREL) 50 MG tablet Take 25 mg by mouth at bedtime as needed for sleep.    prn at prn   Scheduled: . amLODipine  5 mg Oral Daily  . aspirin EC  81 mg Oral Daily  . enoxaparin (LOVENOX) injection  40 mg Subcutaneous Q24H  . levETIRAcetam  500 mg Oral BID  . linezolid  600 mg Oral Q12H  . [START ON 12/17/2016] Vitamin D (Ergocalciferol)  50,000 Units Oral Q30 days    ROS: Patient unable to provide  Physical Examination: Blood pressure 122/62, pulse 62, temperature 97.6 F (  36.4 C), temperature source Oral, resp. rate 18, height 5' (1.524 m), weight 59 kg (130 lb), SpO2 96 %.  HEENT-  Normocephalic, no lesions, without obvious abnormality.  Normal external eye and conjunctiva.  Normal TM's bilaterally.  Normal auditory canals and external ears. Normal external nose, mucus membranes and septum.  Normal pharynx. Cardiovascular- S1, S2 normal, pulses palpable throughout   Lungs- chest clear, no wheezing, rales, normal symmetric air entry Abdomen- soft, non-tender; bowel sounds normal; no masses,  no organomegaly Extremities- no edema Lymph-no adenopathy palpable Musculoskeletal-no joint tenderness, deformity or swelling Skin-warm and dry, no hyperpigmentation, vitiligo, or suspicious lesions  Neurological Examination    Mental Status: Alert and awake.  Does not follow commands.  Parrots some words but no spontaneous speech. Cranial Nerves: II: Discs flat bilaterally; Blinks to bilateral confrontation, pupils equal, round, reactive to light and accommodation III,IV, VI: ptosis not present, extra-ocular motions intact bilaterally V,VII: smile symmetric VIII: hearing normal bilaterally IX,X: gag reflex present XI: bilateral shoulder shrug XII: unable to test Motor: Moves all extremities against gravity spontaneously with no focal weakness noted.   Sensory: Responds to light stimuli in all extremities Deep Tendon Reflexes: 2+ and symmetric with absent AJ's bilaterally Plantars: Right: withdrawal   Left: withdrawal Cerebellar: Unable to perform due to cognition Gait: not tested due to safety concerns    Laboratory Studies:   Basic Metabolic Panel:  Recent Labs Lab 12/02/16 0909 12/04/16 1150  NA 139  --   K 4.2  --   CL 107  --   CO2 22  --   GLUCOSE 112*  --   BUN 13  --   CREATININE 1.05*  --   CALCIUM 8.8*  --   PHOS  --  3.7    Liver Function Tests:  Recent Labs Lab 12/02/16 0909  AST 30  ALT 11*  ALKPHOS 68  BILITOT 0.9  PROT 7.7  ALBUMIN 3.5   No results for input(s): LIPASE, AMYLASE in the last 168 hours. No results for input(s): AMMONIA in the last 168 hours.  CBC:  Recent Labs Lab 12/02/16 0909 12/04/16 1150  WBC 12.7* 7.0  NEUTROABS 9.4*  --   HGB 13.2 12.6  HCT 40.2 37.5  MCV 86.7 86.1  PLT 153 148*    Cardiac Enzymes: No results for input(s): CKTOTAL, CKMB, CKMBINDEX, TROPONINI in the last 168 hours.  BNP: Invalid input(s): POCBNP  CBG: No results for input(s): GLUCAP in the last 168 hours.  Microbiology: Results for orders placed or performed during the hospital encounter of 12/02/16  Urine Culture     Status: Abnormal (Preliminary result)   Collection Time: 12/02/16  9:09 AM  Result Value Ref Range Status   Specimen Description URINE,  RANDOM  Final   Special Requests NONE  Final   Culture (A)  Final    >=100,000 COLONIES/mL ENTEROCOCCUS FAECIUM SUSCEPTIBILITIES TO FOLLOW Performed at [facility], [address] [place], [place] [address]    Report Status PENDING  Incomplete  MRSA PCR Screening     Status: None   Collection Time: 12/02/16  5:11 PM  Result Value Ref Range Status   MRSA by PCR NEGATIVE NEGATIVE Final    Comment:        The GeneXpert MRSA Assay (FDA approved for NASAL specimens only), is one component of a comprehensive MRSA colonization surveillance program. It is not intended to diagnose MRSA infection nor to guide or monitor treatment for MRSA infections.  Coagulation Studies: No results for input(s): LABPROT, INR in the last 72 hours.  Urinalysis:  Recent Labs Lab 12/02/16 0909  COLORURINE YELLOW*  LABSPEC 1.012  PHURINE 6.0  GLUCOSEU NEGATIVE  HGBUR NEGATIVE  BILIRUBINUR NEGATIVE  KETONESUR NEGATIVE  PROTEINUR NEGATIVE  NITRITE NEGATIVE  LEUKOCYTESUR NEGATIVE    Lipid Panel:  No results found for: CHOL, TRIG, HDL, CHOLHDL, VLDL, LDLCALC  HgbA1C: No results found for: HGBA1C  Urine Drug Screen:     Component Value Date/Time   LABOPIA NONE DETECTED 12/02/2016 0909   COCAINSCRNUR NONE DETECTED 12/02/2016 0909   LABBENZ NONE DETECTED 12/02/2016 0909   AMPHETMU NONE DETECTED 12/02/2016 0909   THCU NONE DETECTED 12/02/2016 0909   LABBARB NONE DETECTED 12/02/2016 0909    Alcohol Level:  Recent Labs Lab 12/02/16 0909  ETH <5    Other results: EKG: sinus rhythm at 99 bpm.  Imaging: Mr Laqueta Jean ON Contrast  Result Date: 12/02/2016 CLINICAL DATA:  83 y/o  F; witnessed seizure.  History of dementia. EXAM: MRI HEAD WITHOUT AND WITH CONTRAST TECHNIQUE: Multiplanar, multiecho pulse sequences of the brain and surrounding structures were obtained without and with intravenous contrast. CONTRAST:  10mL MULTIHANCE GADOBENATE DIMEGLUMINE 529 MG/ML IV SOLN COMPARISON:   12/02/2016 CT of the head. FINDINGS: Brain: Severe motion degradation. No acute infarction, hemorrhage, hydrocephalus, extra-axial collection, mass lesion, or abnormal enhancement identified. Chronic microvascular ischemic changes and advanced parenchymal volume loss of the brain. Vascular: Central flow voids are present. Skull and upper cervical spine: Normal marrow signal. Sinuses/Orbits: Negative. Other: None. IMPRESSION: 1. Severe motion degradation of all sequences. 2. No acute intracranial abnormality or abnormal enhancement identified. 3. Advanced brain parenchymal volume loss. Electronically Signed   By: Mitzi Hansen M.D.   On: 12/02/2016 19:22     Assessment/Plan: 83 year old female with advanced dementia presenting after a witnessed seizure.  Patient with no prior history of seizures.  Lab work unremarkable.  MRI of the brain reviewed and shows no acute changes.  EEG with artifact but otherwise unremarkable.  Seizure likely related to dementia.  Patient loaded and started on maintenance Keppra.  Tolerating well and appears at baseline today.    Recommendations: 1.  Continue Keppra at 500mg  BID.  Would change to po 2.  Serum magnesium and phosphorus 3.  Seizure precautions 4.  No further neurologic intervention is recommended at this time.  If further questions arise, please call or page at that time.  Thank you for allowing neurology to participate in the care of this patient.   Thana Farr, MD Neurology [phone] 12/04/2016, 1:32 PM

## 2016-12-05 LAB — MAGNESIUM: Magnesium: 1.9 mg/dL (ref 1.7–2.4)

## 2016-12-05 MED ORDER — LINEZOLID 100 MG/5ML PO SUSR: 600.0000 mg | Freq: Two times a day (BID) | ORAL | 0 refills | Status: DC

## 2016-12-05 MED ORDER — LEVETIRACETAM 100 MG/ML PO SOLN: 500.0000 mg | Freq: Two times a day (BID) | ORAL | 12 refills | Status: DC

## 2016-12-05 NOTE — Progress Notes (Signed)
MD ordered patient to be discharged back to [facility].  Patient's niece was notified of the discharge and was in agreement.  CSW prepared some paperwork and said report did not need to be called.  Jasmine AweBill Walker with [facility] come to get the patient.  Patient's discharge paperwork was sent with Bill.  IV was removed with catheter intact.  Patient left via wheelchair escorted by [facility].

## 2016-12-05 NOTE — NC FL2 (Signed)
[place] MEDICAID [id number] LEVEL OF CARE SCREENING TOOL     IDENTIFICATION  Patient Name: Nancy Montgomery Birthdate: 09/23/1930 Sex: female Admission Date (Current Location): 12/02/2016  [facility] and [place] Number:  Chiropodist and Address:  [facility], [address] [place], [place] [address]      Provider Number: [id number]  Attending Physician Name and Address:  Katha Hamming, MD  Relative Name and Phone Number:       Current Level of Care: Hospital Recommended Level of Care: Memory Care Prior Approval Number:    Date Approved/Denied:   PASRR Number:    Discharge Plan: Other (Comment) (Memory Care Unit)    Current Diagnoses: Patient Active Problem List   Diagnosis Date Noted  . Seizure (HCC) 12/02/2016  . Acute delirium 12/30/2015  . Metabolic encephalopathy 12/30/2015    Orientation RESPIRATION BLADDER Height & Weight     Self  Normal Incontinent Weight: 130 lb (59 kg) Height:  5' (152.4 cm)  BEHAVIORAL SYMPTOMS/MOOD NEUROLOGICAL BOWEL NUTRITION STATUS      Incontinent Diet (DYS 2, Thin Liquids)  AMBULATORY STATUS COMMUNICATION OF NEEDS Skin   Extensive Assist Verbally Normal                       Personal Care Assistance Level of Assistance  Bathing, Feeding, Dressing Bathing Assistance: Limited assistance Feeding assistance: Independent Dressing Assistance: Limited assistance     Functional Limitations Info  Sight, Hearing, Speech Sight Info: Adequate Hearing Info: Adequate Speech Info: Adequate    SPECIAL CARE FACTORS FREQUENCY                       Contractures Contractures Info: Not present    Additional Factors Info  Code Status, Allergies, Isolation Precautions Code Status Info: DNR Allergies Info: No know allergies     Isolation Precautions Info: Contact Isolation for VRE     Current Medications (12/05/2016):  This is the current hospital active medication list Current  Facility-Administered Medications  Medication Dose Route Frequency Provider Last Rate Last Dose  . acetaminophen (TYLENOL) tablet 650 mg  650 mg Oral Q6H PRN Gracelyn Nurse, MD       Or  . acetaminophen (TYLENOL) suppository 650 mg  650 mg Rectal Q6H PRN Gracelyn Nurse, MD      . amLODipine (NORVASC) tablet 5 mg  5 mg Oral Daily Gracelyn Nurse, MD   5 mg at 12/04/16 1107  . aspirin EC tablet 81 mg  81 mg Oral Daily Gracelyn Nurse, MD   81 mg at 12/04/16 1107  . enoxaparin (LOVENOX) injection 40 mg  40 mg Subcutaneous Q24H Gracelyn Nurse, MD   40 mg at 12/04/16 2105  . levETIRAcetam (KEPPRA) 100 MG/ML solution 500 mg  500 mg Oral BID Katha Hamming, MD   500 mg at 12/04/16 2106  . linezolid (ZYVOX) 100 MG/5ML suspension 600 mg  600 mg Oral Q12H Katha Hamming, MD   600 mg at 12/04/16 2106  . loperamide (IMODIUM) 1 MG/5ML solution 2 mg  2 mg Oral PRN Gracelyn Nurse, MD      . LORazepam (ATIVAN) injection 1 mg  1 mg Intravenous Q4H PRN Gracelyn Nurse, MD   1 mg at 12/02/16 1827  . ondansetron (ZOFRAN-ODT) disintegrating tablet 4 mg  4 mg Oral Q8H PRN Gracelyn Nurse, MD      . traZODone (DESYREL) tablet 25 mg  25 mg Oral QHS PRN Gracelyn NurseJohnston, John D, MD      . Melene Muller[START ON 12/17/2016] Vitamin D (Ergocalciferol) (DRISDOL) capsule 50,000 Units  50,000 Units Oral Q30 days Gracelyn NurseJohnston, John D, MD         Discharge Medications: Please see discharge summary for a list of discharge medications.  Relevant Imaging Results:  Relevant Lab Results:   Additional Information SSN:  [id number]  Dede QuerySarah McNulty, LCSW

## 2016-12-05 NOTE — Progress Notes (Signed)
Spoke to Dr. Luberta MutterKonidena about lab saying that they were having to send the patients urine culture to [facility] since they could not run it at [facility] lab.  She just acknowledged.

## 2016-12-05 NOTE — Clinical Social Work Note (Signed)
Pt is ready for discharge today and will return to [facility]. [facility] will provide transportation. [facility] is able to accept pt at discharge. Pt's niece is aware and agreeable to discharge plan. RN is aware as well. CSW is signing off as no further needs identified.   Dede QuerySarah Teasha Murrillo, MSW, LCSW  Clinical Social Worker  [phone]

## 2016-12-05 NOTE — Discharge Summary (Addendum)
Kym Groomnnie M Frank, is a 83 y.o. female  DOB 09/08/1930  MRN [id number].  Admission date:  12/02/2016  Admitting Physician  Gracelyn NurseJohn D Johnston, MD  Discharge Date:  12/05/2016   Primary MD  Precious ReelBlake, Mary, NP  Recommendations for primary care physician for things to follow:  Follow-up with PCP in 2 weeks   Admission Diagnosis  Seizure (HCC) [R56.9] Acute cystitis without hematuria [N30.00]   Discharge Diagnosis  Seizure (HCC) [R56.9] Acute cystitis without hematuria [N30.00]    Active Problems:   Seizure [facility](HCC)      Past Medical History:  Diagnosis Date  . Alzheimer disease   . Diverticulosis   . Hypertension   . Prediabetes   . Renal disorder     Past Surgical History:  Procedure Laterality Date  . NO PAST SURGERIES         History of present illness and  Hospital Course:     Kindly see H&P for history of present illness and admission details, please review complete Labs, Consult reports and Test reports for all details in brief  HPI  from the history and physical done on the day of admission 83 year old female patient with history of advanced dementia admitted for witnessed seizure at her facility, admitted for the same. Patient received Ativan, started on Keppra IV.   Hospital Course   #1.seizure likely related  To dementia; new onset seizures without evidence of structural brain disease. MRI of the brain, head CAT scan did not show any structural abnormalities, stroke. Patient is given IV Keppra 500 mg twice a day for 24 hours, seen by neurology, neurologist recommended Keppra 500 mg by mouth twice a day. Patient did not have any further seizures since admission.  Discharge back  To SNF today.   2. Enterococcal UTI. Patient was started on Zyvox  SuspensionZyvox 600 mg by mouth twice a day for 10 days .  Non  verbal at baseline  Discharge Condition: stable   Follow UP      Discharge Instructions  and  Discharge Medications     Allergies as of 12/05/2016   No Known Allergies     Medication List    STOP taking these medications   dextromethorphan-guaiFENesin 30-600 MG 12hr tablet Commonly known as:  MUCINEX DM     TAKE these medications   acetaminophen 650 MG suppository Commonly known as:  TYLENOL Place 650 mg rectally every 6 (six) hours as needed.   acetaminophen 325 MG tablet Commonly known as:  TYLENOL Take 650 mg by mouth every 6 (six) hours as needed.   amLODipine 5 MG tablet Commonly known as:  NORVASC Take 5 mg by mouth daily.   aspirin 81 MG tablet Take 81 mg by mouth daily.   ergocalciferol 50000 units capsule Commonly known as:  VITAMIN D2 Take 50,000 Units by mouth every 30 (thirty) days.   famotidine 20 MG tablet Commonly known as:  PEPCID Take 1 tablet (20 mg total) by mouth daily.   levETIRAcetam 100 MG/ML solution Commonly known as:  KEPPRA Take 5 mLs (500 mg total) by mouth 2 (two) times daily.   linezolid 100 MG/5ML suspension Commonly known as:  ZYVOX Take 30 mLs (600 mg total) by mouth every 12 (twelve) hours.   loperamide 1 MG/5ML solution Commonly known as:  IMODIUM Take 2 mg by mouth as needed for diarrhea or loose stools.   magnesium hydroxide 400 MG/5ML suspension Commonly known as:  MILK OF MAGNESIA Take 30 mLs by mouth  daily as needed for mild constipation.   ondansetron 4 MG disintegrating tablet Commonly known as:  ZOFRAN-ODT Take 4 mg by mouth every 8 (eight) hours as needed for nausea or vomiting.   predniSONE 20 MG tablet Commonly known as:  DELTASONE Take 3 tablets (60 mg total) by mouth daily.   promethazine 12.5 MG suppository Commonly known as:  PHENERGAN Place 12.5 mg rectally every 6 (six) hours as needed for nausea or vomiting.   traZODone 50 MG tablet Commonly known as:  DESYREL Take 25 mg by mouth at  bedtime as needed for sleep.         Diet and Activity recommendation: See Discharge Instructions above   Consults obtained - neurology   Major procedures and Radiology Reports - PLEASE review detailed and final reports for all details, in brief -      Ct Head Wo Contrast  Result Date: 12/02/2016 CLINICAL DATA:  Altered mental status with questionable seizure. EXAM: CT HEAD WITHOUT CONTRAST TECHNIQUE: Contiguous axial images were obtained from the base of the skull through the vertex without intravenous contrast. COMPARISON:  Oct 13, 2016 FINDINGS: Brain: There is moderate diffuse atrophy. There is no intracranial mass, hemorrhage, extra-axial fluid collection, or midline shift. There is patchy small vessel disease in the centra semiovale bilaterally. Elsewhere gray-white compartments appear normal. No evident acute infarct. Vascular: No appreciable hyperdense vessel. There is no appreciable vascular calcification. Skull: Bony calvarium appears intact. Sinuses/Orbits: There is mild mucosal thickening in several ethmoid air cells. Other visualized paranasal sinuses are clear. Visualized orbits appear symmetric bilaterally. Other: Mastoid air cells are clear. IMPRESSION: Atrophy with patchy periventricular small vessel disease. No intracranial mass, hemorrhage, or extra-axial fluid collection. No acute infarct. Mild ethmoid sinus disease bilaterally. Electronically Signed   By: Bretta Bang III M.D.   On: 12/02/2016 10:09   Mr Laqueta Jean ZO Contrast  Result Date: 12/02/2016 CLINICAL DATA:  83 y/o  F; witnessed seizure.  History of dementia. EXAM: MRI HEAD WITHOUT AND WITH CONTRAST TECHNIQUE: Multiplanar, multiecho pulse sequences of the brain and surrounding structures were obtained without and with intravenous contrast. CONTRAST:  10mL MULTIHANCE GADOBENATE DIMEGLUMINE 529 MG/ML IV SOLN COMPARISON:  12/02/2016 CT of the head. FINDINGS: Brain: Severe motion degradation. No acute infarction,  hemorrhage, hydrocephalus, extra-axial collection, mass lesion, or abnormal enhancement identified. Chronic microvascular ischemic changes and advanced parenchymal volume loss of the brain. Vascular: Central flow voids are present. Skull and upper cervical spine: Normal marrow signal. Sinuses/Orbits: Negative. Other: None. IMPRESSION: 1. Severe motion degradation of all sequences. 2. No acute intracranial abnormality or abnormal enhancement identified. 3. Advanced brain parenchymal volume loss. Electronically Signed   By: Mitzi Hansen M.D.   On: 12/02/2016 19:22    Micro Results     Recent Results (from the past 240 hour(s))  Urine Culture     Status: Abnormal (Preliminary result)   Collection Time: 12/02/16  9:09 AM  Result Value Ref Range Status   Specimen Description URINE, RANDOM  Final   Special Requests NONE  Final   Culture (A)  Final    >=100,000 COLONIES/mL ENTEROCOCCUS FAECIUM SUSCEPTIBILITIES TO FOLLOW Performed at [facility], [address], [place], [place] [address]    Report Status PENDING  Incomplete  MRSA PCR Screening     Status: None   Collection Time: 12/02/16  5:11 PM  Result Value Ref Range Status   MRSA by PCR NEGATIVE NEGATIVE Final    Comment:  The GeneXpert MRSA Assay (FDA approved for NASAL specimens only), is one component of a comprehensive MRSA colonization surveillance program. It is not intended to diagnose MRSA infection nor to guide or monitor treatment for MRSA infections.        Today   Subjective:   Nancy Montgomery today Is stable for discharge.  Objective:   Blood pressure (!) 141/61, pulse 63, temperature (!) 97.4 F (36.3 C), temperature source Oral, resp. rate 18, height 5' (1.524 m), weight 59 kg (130 lb), SpO2 99 %.   Intake/Output Summary (Last 24 hours) at 12/05/16 0913 Last data filed at 12/05/16 0400  Gross per 24 hour  Intake              200 ml  Output                0 ml  Net               200 ml    Exam Awake But demented. Nonverbal at baseline.  [place].AT,PERRAL Supple Neck,No JVD, No cervical lymphadenopathy appriciated.  Symmetrical Chest wall movement, Good air movement bilaterally, CTAB RRR,No Gallops,Rubs or new Murmurs, No Parasternal Heave +ve B.Sounds, Abd Soft, Non tender, No organomegaly appriciated, No rebound -guarding or rigidity. No Cyanosis, Clubbing or edema, No new Rash or bruise  Data Review   CBC w Diff: Lab Results  Component Value Date   WBC 7.0 12/04/2016   HGB 12.6 12/04/2016   HCT 37.5 12/04/2016   PLT 148 (L) 12/04/2016   LYMPHOPCT 16 12/02/2016   MONOPCT 9 12/02/2016   EOSPCT 0 12/02/2016   BASOPCT 1 12/02/2016    CMP: Lab Results  Component Value Date   NA 139 12/02/2016   K 4.2 12/02/2016   CL 107 12/02/2016   CO2 22 12/02/2016   BUN 13 12/02/2016   CREATININE 1.05 (H) 12/02/2016   PROT 7.7 12/02/2016   ALBUMIN 3.5 12/02/2016   BILITOT 0.9 12/02/2016   ALKPHOS 68 12/02/2016   AST 30 12/02/2016   ALT 11 (L) 12/02/2016  .   Total Time in preparing paper work, data evaluation and todays exam - 35 minutes  Icey Tello M.D on 12/05/2016 at 9:13 AM    Note: This dictation was prepared with Dragon dictation along with smaller phrase technology. Any transcriptional errors that result from this process are unintentional.

## 2016-12-12 LAB — SUSCEPTIBILITY, AER + ANAEROB

## 2016-12-12 LAB — URINE CULTURE

## 2016-12-12 LAB — SUSCEPTIBILITY RESULT

## 2017-05-08 ENCOUNTER — Encounter: Payer: Self-pay | Admitting: Emergency Medicine

## 2017-05-08 ENCOUNTER — Emergency Department
Admission: EM | Admit: 2017-05-08 | Discharge: 2017-05-08 | Disposition: A | Discharge status: Home / Self Care | Payer: Medicare (Managed Care) | Attending: Emergency Medicine | Admitting: Emergency Medicine

## 2017-05-08 ENCOUNTER — Other Ambulatory Visit: Payer: Self-pay

## 2017-05-08 DIAGNOSIS — Z79899 Other long term (current) drug therapy: Secondary | ICD-10-CM | POA: Diagnosis not present

## 2017-05-08 DIAGNOSIS — T5791XA Toxic effect of unspecified inorganic substance, accidental (unintentional), initial encounter: Secondary | ICD-10-CM | POA: Diagnosis present

## 2017-05-08 DIAGNOSIS — F0280 Dementia in other diseases classified elsewhere without behavioral disturbance: Secondary | ICD-10-CM | POA: Insufficient documentation

## 2017-05-08 DIAGNOSIS — G309 Alzheimer's disease, unspecified: Secondary | ICD-10-CM | POA: Insufficient documentation

## 2017-05-08 DIAGNOSIS — I10 Essential (primary) hypertension: Secondary | ICD-10-CM | POA: Insufficient documentation

## 2017-05-08 DIAGNOSIS — T783XXA Angioneurotic edema, initial encounter: Secondary | ICD-10-CM

## 2017-05-08 MED ORDER — METHYLPREDNISOLONE SODIUM SUCC 125 MG IJ SOLR
62.5000 mg | Freq: Once | INTRAMUSCULAR | Status: AC
  Administered 2017-05-08: 62.5 mg via INTRAVENOUS
  Filled 2017-05-08: qty 2

## 2017-05-08 MED ORDER — DIPHENHYDRAMINE HCL 50 MG/ML IJ SOLN
25.0000 mg | Freq: Once | INTRAMUSCULAR | Status: AC
Start: 2017-05-08 — End: 2017-05-08
  Administered 2017-05-08: 25 mg via INTRAVENOUS
  Filled 2017-05-08: qty 1

## 2017-05-08 NOTE — ED Notes (Signed)
Attempted iv insertion with 22g to right hand without success.

## 2017-05-08 NOTE — ED Notes (Signed)
ED Provider at bedside. 

## 2017-05-08 NOTE — ED Triage Notes (Signed)
Patient presents to Emergency Department from [facility] via EMS with complaints of eating dial (antibacterial) bar soap and subsequent swelling to lips and tongue.    Facility gave approx 12.5mg  benadryl approx 1400.  Pt has severe dementia.  Pt facility this isn't the first time time pt has eaten soap.  Lips and mouth appears swollen and pt is drooling.

## 2017-05-08 NOTE — ED Provider Notes (Signed)
[facility] Emergency Department Provider Note  Time seen: 7:52 PM  I have reviewed the triage vital signs and the nursing notes.   HISTORY  Chief Complaint Ingestion    HPI Nancy Montgomery is a 83 y.o. female with a past medical history of severe dementia, hypertension, presents to the emergency department after an ingestion of soap and possible allergic reaction.  According to staff per EMS report the patient ate several bites out of a bar of antibacterial soap.  EMS states staff reports this has happened previously as well.  This happened approximately 2-1/2 hours prior to arrival, they were monitoring the patient at the facility but she began to have lip and tongue swelling they gave her 12.5 mg of Benadryl and transported her to the hospital for further evaluation.  Patient is calm, lying in bed, no distress.  She does have mild drooling with moderately enlarged lips and tongue but no apparent airway difficulties, no trouble breathing.  Patient is unable to contribute to her history or answer review of systems questioning due to severe dementia.   Past Medical History:  Diagnosis Date  . Alzheimer disease   . Diverticulosis   . Hypertension   . Prediabetes   . Renal disorder     Patient Active Problem List   Diagnosis Date Noted  . Seizure (HCC) 12/02/2016  . Acute delirium 12/30/2015  . Metabolic encephalopathy 12/30/2015    Past Surgical History:  Procedure Laterality Date  . NO PAST SURGERIES      Prior to Admission medications   Medication Sig Start Date End Date Taking? Authorizing Provider  acetaminophen (TYLENOL) 100 MG/ML solution Take 500 mg by mouth every 6 (six) hours as needed for fever.   Yes [provider]  acetaminophen (TYLENOL) 325 MG tablet Take 325-650 mg by mouth every 8 (eight) hours as needed for fever.    Yes [provider]  amLODipine (NORVASC) 5 MG tablet Take 5 mg by mouth daily.   Yes [provider]  Cholecalciferol (VITAMIN D-3) 1000 units CAPS Take 1,000 Units by mouth daily.   Yes [provider]  dextromethorphan-guaiFENesin (MUCINEX DM) 30-600 MG 12hr tablet Take 1 tablet by mouth every 12 (twelve) hours as needed for cough.   Yes [provider]  loperamide (IMODIUM) 1 MG/5ML solution Take 2 mg by mouth as needed for diarrhea or loose stools. Maximum 5 doses per 24 hours   Yes [provider]  magnesium hydroxide (MILK OF MAGNESIA) 400 MG/5ML suspension Take 30 mLs by mouth daily as needed for mild constipation.   Yes [provider]  memantine (NAMENDA) 5 MG tablet Take 10 mg by mouth daily.   Yes [provider]  ondansetron (ZOFRAN-ODT) 4 MG disintegrating tablet Take 4 mg by mouth every 8 (eight) hours as needed for nausea or vomiting.   Yes [provider]  promethazine (PHENERGAN) 12.5 MG suppository Place 12.5 mg rectally every 6 (six) hours as needed for nausea or vomiting.   Yes [provider]  levETIRAcetam (KEPPRA) 100 MG/ML solution Take 5 mLs (500 mg total) by mouth 2 (two) times daily. Patient not taking: Reported on 05/08/2017 12/05/16   Katha HammingKonidena, Snehalatha, MD  linezolid (ZYVOX) 100 MG/5ML suspension Take 30 mLs (600 mg total) by mouth every 12 (twelve) hours. Patient not taking: Reported on 05/08/2017 12/05/16   Katha HammingKonidena, Snehalatha, MD    No Known Allergies  Family History  Problem Relation Age of Onset  .  Hypertension Other     Social History Social History   Tobacco Use  . Smoking status: Never Smoker  . Smokeless tobacco: Never Used  Substance Use Topics  . Alcohol use: No  . Drug use: No    Review of Systems Unable to obtain an adequate/accurate review of systems secondary to severe dementia. ____________________________________________   PHYSICAL EXAM:  VITAL SIGNS: ED Triage Vitals  Enc Vitals Group     BP 05/08/17 1919 (!) 177/94     Pulse Rate 05/08/17 1919 77      Resp 05/08/17 1919 18     Temp 05/08/17 1919 98 F (36.7 C)     Temp Source 05/08/17 1919 Oral     SpO2 05/08/17 1919 95 %     Weight 05/08/17 1921 125 lb (56.7 kg)     Height 05/08/17 1921 5\' 3"  (1.6 m)     Head Circumference --      Peak Flow --      Pain Score --      Pain Loc --      Pain Edu? --      Excl. in GC? --     Constitutional: Alert and oriented. Well appearing and in no distress. Eyes: Normal exam ENT   Head: Normocephalic and atraumatic.   Mouth/Throat: Moist mucous membranes, moderately swollen lower lip and tongue, airway appears very patent with no difficulty breathing.  No stridor. Cardiovascular: Normal rate, regular rhythm. No murmur Respiratory: Normal respiratory effort without tachypnea nor retractions. Breath sounds are clear.  No wheeze. Gastrointestinal: Soft and nontender. No distention.  Musculoskeletal: Nontender with normal range of motion in all extremities Neurologic:  Normal speech and language. No gross focal neurologic deficits are appreciated. Skin:  Skin is warm, dry and intact.  No rash noted. Psychiatric: Mood and affect are normal. Speech and behavior are normal.   ____________________________________________   INITIAL IMPRESSION / ASSESSMENT AND PLAN / ED COURSE  Pertinent labs & imaging results that were available during my care of the patient were reviewed by me and considered in my medical decision making (see chart for details).  Patient presents to the emergency department after ingesting soap now with localized swelling to lips and tongue.  Differential would include allergic reaction, local reaction  ----------------------------------------- 10:42 PM on 05/08/2017 -----------------------------------------  Family is here with the patient, the swelling has diminished somewhat, it has definitely not worsened.  Family states they are not sure if she ate soup tonight although she has tried to eat it in the past.  She  has had the swelling multiple times in the past which is very consistent with angioedema.  I reviewed the patient's medications she is not currently on an ACE inhibitor.  As the patient appears well with improving symptoms I believe she is safe for discharge home the family agrees.  ____________________________________________   FINAL CLINICAL IMPRESSION(S) / ED DIAGNOSES  Angioedema    Minna AntisPaduchowski, Meganne Rita, MD 05/08/17 2243

## 2017-05-08 NOTE — ED Notes (Signed)
This RN and Beth NT, changed pt diaper and linens, pt pants placed in bags for Ms Laurence AlyCorbett

## 2017-05-08 NOTE — ED Notes (Addendum)
Pt reported earlier as drooling but this may be at baseline  Pt unable to follow commands but responds (eye contact and utterances) to voice

## 2017-06-08 ENCOUNTER — Encounter
Admission: RE | Admit: 2017-06-08 | Discharge: 2017-06-08 | Disposition: A | Discharge status: Home / Self Care | Payer: Medicare (Managed Care) | Source: Ambulatory Visit | Attending: Internal Medicine | Admitting: Internal Medicine

## 2017-07-17 ENCOUNTER — Inpatient Hospital Stay
Admission: EM | Admit: 2017-07-17 | Discharge: 2017-07-20 | DRG: 871 | Disposition: A | Discharge status: Skilled Nursing Facility | Payer: Medicare (Managed Care) | Attending: Internal Medicine | Admitting: Internal Medicine

## 2017-07-17 ENCOUNTER — Emergency Department: Payer: Medicare (Managed Care)

## 2017-07-17 ENCOUNTER — Other Ambulatory Visit: Payer: Self-pay

## 2017-07-17 ENCOUNTER — Encounter: Payer: Self-pay | Admitting: *Deleted

## 2017-07-17 DIAGNOSIS — F0280 Dementia in other diseases classified elsewhere without behavioral disturbance: Secondary | ICD-10-CM | POA: Diagnosis present

## 2017-07-17 DIAGNOSIS — I10 Essential (primary) hypertension: Secondary | ICD-10-CM | POA: Diagnosis present

## 2017-07-17 DIAGNOSIS — A0811 Acute gastroenteropathy due to Norwalk agent: Secondary | ICD-10-CM | POA: Diagnosis present

## 2017-07-17 DIAGNOSIS — J9601 Acute respiratory failure with hypoxia: Secondary | ICD-10-CM

## 2017-07-17 DIAGNOSIS — A419 Sepsis, unspecified organism: Secondary | ICD-10-CM | POA: Diagnosis present

## 2017-07-17 DIAGNOSIS — G9341 Metabolic encephalopathy: Secondary | ICD-10-CM | POA: Diagnosis present

## 2017-07-17 DIAGNOSIS — E876 Hypokalemia: Secondary | ICD-10-CM

## 2017-07-17 DIAGNOSIS — Z66 Do not resuscitate: Secondary | ICD-10-CM | POA: Diagnosis present

## 2017-07-17 DIAGNOSIS — G309 Alzheimer's disease, unspecified: Secondary | ICD-10-CM | POA: Diagnosis present

## 2017-07-17 DIAGNOSIS — N390 Urinary tract infection, site not specified: Secondary | ICD-10-CM | POA: Diagnosis present

## 2017-07-17 DIAGNOSIS — G9340 Encephalopathy, unspecified: Secondary | ICD-10-CM

## 2017-07-17 DIAGNOSIS — J690 Pneumonitis due to inhalation of food and vomit: Secondary | ICD-10-CM | POA: Diagnosis not present

## 2017-07-17 DIAGNOSIS — N179 Acute kidney failure, unspecified: Secondary | ICD-10-CM

## 2017-07-17 DIAGNOSIS — Z79899 Other long term (current) drug therapy: Secondary | ICD-10-CM | POA: Diagnosis not present

## 2017-07-17 DIAGNOSIS — J9690 Respiratory failure, unspecified, unspecified whether with hypoxia or hypercapnia: Secondary | ICD-10-CM

## 2017-07-17 DIAGNOSIS — J189 Pneumonia, unspecified organism: Secondary | ICD-10-CM | POA: Diagnosis not present

## 2017-07-17 DIAGNOSIS — J9600 Acute respiratory failure, unspecified whether with hypoxia or hypercapnia: Secondary | ICD-10-CM | POA: Diagnosis present

## 2017-07-17 LAB — COMPREHENSIVE METABOLIC PANEL
ALK PHOS: 64 U/L (ref 38–126)
ALT: 13 U/L — ABNORMAL LOW (ref 14–54)
ALT: 13 U/L — AB (ref 14–54)
ANION GAP: 11 (ref 5–15)
AST: 40 U/L (ref 15–41)
AST: 52 U/L — ABNORMAL HIGH (ref 15–41)
Albumin: 3.5 g/dL (ref 3.5–5.0)
Albumin: 3.2 g/dL — ABNORMAL LOW (ref 3.5–5.0)
Alkaline Phosphatase: 48 U/L (ref 38–126)
Anion gap: 10 (ref 5–15)
BILIRUBIN TOTAL: 0.7 mg/dL (ref 0.3–1.2)
BUN: 20 mg/dL (ref 6–20)
BUN: 19 mg/dL (ref 6–20)
CALCIUM: 8.1 mg/dL — AB (ref 8.9–10.3)
CHLORIDE: 110 mmol/L (ref 101–111)
CO2: 20 mmol/L — ABNORMAL LOW (ref 22–32)
CO2: 22 mmol/L (ref 22–32)
CREATININE: 1.2 mg/dL — AB (ref 0.44–1.00)
Calcium: 8.1 mg/dL — ABNORMAL LOW (ref 8.9–10.3)
Chloride: 109 mmol/L (ref 101–111)
Creatinine, Ser: 0.84 mg/dL (ref 0.44–1.00)
GFR calc Af Amer: >60 mL/min (ref >60)
GFR calc Af Amer: 47 mL/min — ABNORMAL LOW (ref >60)
GFR, EST NON AFRICAN AMERICAN: 40 mL/min — AB (ref >60)
Glucose, Bld: 210 mg/dL — ABNORMAL HIGH (ref 65–99)
Glucose, Bld: 143 mg/dL — ABNORMAL HIGH (ref 65–99)
POTASSIUM: 2.7 mmol/L — AB (ref 3.5–5.1)
Potassium: 4 mmol/L (ref 3.5–5.1)
SODIUM: 142 mmol/L (ref 135–145)
Sodium: 140 mmol/L (ref 135–145)
TOTAL PROTEIN: 7.9 g/dL (ref 6.5–8.1)
Total Bilirubin: 1 mg/dL (ref 0.3–1.2)
Total Protein: 7.3 g/dL (ref 6.5–8.1)

## 2017-07-17 LAB — GASTROINTESTINAL PANEL BY PCR, STOOL (REPLACES STOOL CULTURE)
Adenovirus F40/41: NOT DETECTED
Astrovirus: NOT DETECTED
CRYPTOSPORIDIUM: NOT DETECTED
CYCLOSPORA CAYETANENSIS: NOT DETECTED
Campylobacter species: NOT DETECTED
ENTAMOEBA HISTOLYTICA: NOT DETECTED
ENTEROAGGREGATIVE E COLI (EAEC): NOT DETECTED
Enteropathogenic E coli (EPEC): NOT DETECTED
Enterotoxigenic E coli (ETEC): NOT DETECTED
GIARDIA LAMBLIA: NOT DETECTED
Norovirus GI/GII: DETECTED — AB
Plesimonas shigelloides: NOT DETECTED
Rotavirus A: NOT DETECTED
SALMONELLA SPECIES: NOT DETECTED
SAPOVIRUS (I, II, IV, AND V): NOT DETECTED
SHIGA LIKE TOXIN PRODUCING E COLI (STEC): NOT DETECTED
SHIGELLA/ENTEROINVASIVE E COLI (EIEC): NOT DETECTED
VIBRIO CHOLERAE: NOT DETECTED
VIBRIO SPECIES: NOT DETECTED
YERSINIA ENTEROCOLITICA: NOT DETECTED

## 2017-07-17 LAB — CBC WITH DIFFERENTIAL/PLATELET
BASOS ABS: 0 10*3/uL (ref 0–0.1)
BASOS ABS: 0 10*3/uL (ref 0–0.1)
BASOS PCT: 0 %
Basophils Relative: 0 %
EOS ABS: 0 10*3/uL (ref 0–0.7)
EOS ABS: 0 10*3/uL (ref 0–0.7)
EOS PCT: 0 %
Eosinophils Relative: 0 %
HCT: 44.1 % (ref 35.0–47.0)
HEMATOCRIT: 46.5 % (ref 35.0–47.0)
HEMOGLOBIN: 15.4 g/dL (ref 12.0–16.0)
Hemoglobin: 14.1 g/dL (ref 12.0–16.0)
Lymphocytes Relative: 34 %
Lymphocytes Relative: 10 %
Lymphs Abs: 1.1 10*3/uL (ref 1.0–3.6)
Lymphs Abs: 0.2 10*3/uL — ABNORMAL LOW (ref 1.0–3.6)
MCH: 29.3 pg (ref 26.0–34.0)
MCH: 28.8 pg (ref 26.0–34.0)
MCHC: 33.1 g/dL (ref 32.0–36.0)
MCHC: 32.1 g/dL (ref 32.0–36.0)
MCV: 88.8 fL (ref 80.0–100.0)
MCV: 89.9 fL (ref 80.0–100.0)
MONO ABS: 0.2 10*3/uL (ref 0.2–0.9)
Monocytes Absolute: 0.1 10*3/uL — ABNORMAL LOW (ref 0.2–0.9)
Monocytes Relative: 3 %
Monocytes Relative: 10 %
NEUTROS ABS: 2.1 10*3/uL (ref 1.4–6.5)
Neutro Abs: 1.4 10*3/uL (ref 1.4–6.5)
Neutrophils Relative %: 63 %
Neutrophils Relative %: 80 %
PLATELETS: 141 10*3/uL — AB (ref 150–440)
Platelets: 164 10*3/uL (ref 150–440)
RBC: 5.23 MIL/uL — ABNORMAL HIGH (ref 3.80–5.20)
RBC: 4.9 MIL/uL (ref 3.80–5.20)
RDW: 14.6 % — AB (ref 11.5–14.5)
RDW: 14.2 % (ref 11.5–14.5)
WBC: 3.4 10*3/uL — ABNORMAL LOW (ref 3.6–11.0)
WBC: 1.7 10*3/uL — ABNORMAL LOW (ref 3.6–11.0)

## 2017-07-17 LAB — PROCALCITONIN: Procalcitonin: 8.21 ng/mL

## 2017-07-17 LAB — C DIFFICILE QUICK SCREEN W PCR REFLEX
C DIFFICILE (CDIFF) TOXIN: NEGATIVE
C Diff antigen: NEGATIVE
C Diff interpretation: NOT DETECTED

## 2017-07-17 LAB — LACTIC ACID, PLASMA
LACTIC ACID, VENOUS: 3.8 mmol/L — AB (ref 0.5–1.9)
LACTIC ACID, VENOUS: 5.2 mmol/L — AB (ref 0.5–1.9)
Lactic Acid, Venous: 4.5 mmol/L (ref 0.5–1.9)

## 2017-07-17 LAB — URINALYSIS, ROUTINE W REFLEX MICROSCOPIC
Bilirubin Urine: NEGATIVE
GLUCOSE, UA: 50 mg/dL — AB
HGB URINE DIPSTICK: NEGATIVE
Ketones, ur: NEGATIVE mg/dL
Leukocytes, UA: NEGATIVE
NITRITE: POSITIVE — AB
Protein, ur: NEGATIVE mg/dL
Specific Gravity, Urine: 1.015 (ref 1.005–1.030)
pH: 5 (ref 5.0–8.0)

## 2017-07-17 LAB — GLUCOSE, CAPILLARY: Glucose-Capillary: 143 mg/dL — ABNORMAL HIGH (ref 65–99)

## 2017-07-17 LAB — BLOOD GAS, VENOUS
Acid-base deficit: 5.4 mmol/L — ABNORMAL HIGH (ref 0.0–2.0)
BICARBONATE: 23.3 mmol/L (ref 20.0–28.0)
O2 SAT: 67.5 %
PATIENT TEMPERATURE: 37
PCO2 VEN: 57 mmHg (ref 44.0–60.0)
PO2 VEN: 43 mmHg (ref 32.0–45.0)
pH, Ven: 7.22 — ABNORMAL LOW (ref 7.250–7.430)

## 2017-07-17 LAB — PROTIME-INR
INR: 1.09
Prothrombin Time: 14 seconds (ref 11.4–15.2)

## 2017-07-17 LAB — APTT: APTT: 25 s (ref 24–36)

## 2017-07-17 LAB — MRSA PCR SCREENING: MRSA BY PCR: NEGATIVE

## 2017-07-17 MED ORDER — PIPERACILLIN-TAZOBACTAM 3.375 G IVPB
3.3750 g | Freq: Three times a day (TID) | INTRAVENOUS | Status: DC
  Administered 2017-07-17 – 2017-07-20 (×9): 3.375 g via INTRAVENOUS
  Filled 2017-07-17 (×9): qty 50

## 2017-07-17 MED ORDER — SODIUM CHLORIDE 0.9 % IV BOLUS (SEPSIS): 500.0000 mL | Freq: Once | INTRAVENOUS | Status: DC

## 2017-07-17 MED ORDER — ONDANSETRON HCL 4 MG/2ML IJ SOLN
INTRAMUSCULAR | Status: AC
  Filled 2017-07-17: qty 2

## 2017-07-17 MED ORDER — MEMANTINE HCL 10 MG PO TABS
10.0000 mg | ORAL_TABLET | Freq: Every day | ORAL | Status: DC
  Filled 2017-07-17: qty 1

## 2017-07-17 MED ORDER — INSULIN ASPART 100 UNIT/ML ~~LOC~~ SOLN: 0.0000 [IU] | Freq: Every day | SUBCUTANEOUS | Status: DC

## 2017-07-17 MED ORDER — VANCOMYCIN HCL IN DEXTROSE 1-5 GM/200ML-% IV SOLN
1000.0000 mg | Freq: Once | INTRAVENOUS | Status: AC
  Administered 2017-07-17: 1000 mg via INTRAVENOUS
  Filled 2017-07-17: qty 200

## 2017-07-17 MED ORDER — VANCOMYCIN HCL IN DEXTROSE 1-5 GM/200ML-% IV SOLN
1000.0000 mg | INTRAVENOUS | Status: DC
  Filled 2017-07-17: qty 200

## 2017-07-17 MED ORDER — LACTATED RINGERS IV SOLN
INTRAVENOUS | Status: DC
  Administered 2017-07-17 – 2017-07-18 (×2): via INTRAVENOUS

## 2017-07-17 MED ORDER — CHLORHEXIDINE GLUCONATE 0.12 % MT SOLN
15.0000 mL | Freq: Two times a day (BID) | OROMUCOSAL | Status: DC
  Administered 2017-07-17 – 2017-07-20 (×6): 15 mL via OROMUCOSAL
  Filled 2017-07-17 (×3): qty 15

## 2017-07-17 MED ORDER — CEFEPIME HCL 1 G IJ SOLR
1.0000 g | Freq: Three times a day (TID) | INTRAMUSCULAR | Status: DC
  Filled 2017-07-17 (×3): qty 1

## 2017-07-17 MED ORDER — ENOXAPARIN SODIUM 40 MG/0.4ML ~~LOC~~ SOLN: 40.0000 mg | SUBCUTANEOUS | Status: DC

## 2017-07-17 MED ORDER — SODIUM CHLORIDE 0.9 % IV BOLUS (SEPSIS)
500.0000 mL | Freq: Once | INTRAVENOUS | Status: AC
  Administered 2017-07-17: 500 mL via INTRAVENOUS

## 2017-07-17 MED ORDER — ENOXAPARIN SODIUM 30 MG/0.3ML ~~LOC~~ SOLN
30.0000 mg | SUBCUTANEOUS | Status: DC
  Administered 2017-07-17 – 2017-07-18 (×2): 30 mg via SUBCUTANEOUS
  Filled 2017-07-17 (×2): qty 0.3

## 2017-07-17 MED ORDER — AMLODIPINE BESYLATE 5 MG PO TABS: 5.0000 mg | ORAL_TABLET | Freq: Every day | ORAL | Status: DC

## 2017-07-17 MED ORDER — SODIUM CHLORIDE 0.9 % IV BOLUS (SEPSIS): 1000.0000 mL | Freq: Once | INTRAVENOUS | Status: DC

## 2017-07-17 MED ORDER — VANCOMYCIN HCL IN DEXTROSE 750-5 MG/150ML-% IV SOLN: 750.0000 mg | INTRAVENOUS | Status: DC

## 2017-07-17 MED ORDER — PIPERACILLIN-TAZOBACTAM 3.375 G IVPB 30 MIN
3.3750 g | Freq: Once | INTRAVENOUS | Status: AC
  Administered 2017-07-17: 3.375 g via INTRAVENOUS
  Filled 2017-07-17: qty 50

## 2017-07-17 MED ORDER — SODIUM CHLORIDE 0.9 % IV BOLUS (SEPSIS): 250.0000 mL | Freq: Once | INTRAVENOUS | Status: DC

## 2017-07-17 MED ORDER — ORAL CARE MOUTH RINSE
15.0000 mL | Freq: Two times a day (BID) | OROMUCOSAL | Status: DC
  Administered 2017-07-17 – 2017-07-20 (×6): 15 mL via OROMUCOSAL

## 2017-07-17 MED ORDER — INSULIN ASPART 100 UNIT/ML ~~LOC~~ SOLN: 0.0000 [IU] | Freq: Three times a day (TID) | SUBCUTANEOUS | Status: DC

## 2017-07-17 MED ORDER — LOPERAMIDE HCL 1 MG/5ML PO LIQD
2.0000 mg | ORAL | Status: DC | PRN
  Filled 2017-07-17: qty 10

## 2017-07-17 MED ORDER — POTASSIUM CHLORIDE 10 MEQ/100ML IV SOLN
10.0000 meq | INTRAVENOUS | Status: AC
  Administered 2017-07-17 (×4): 10 meq via INTRAVENOUS
  Filled 2017-07-17 (×4): qty 100

## 2017-07-17 MED ORDER — ONDANSETRON HCL 4 MG/2ML IJ SOLN
4.0000 mg | Freq: Once | INTRAMUSCULAR | Status: AC
  Administered 2017-07-17: 03:00:00 via INTRAVENOUS

## 2017-07-17 MED ORDER — ACETAMINOPHEN 650 MG RE SUPP
650.0000 mg | RECTAL | Status: DC | PRN
  Administered 2017-07-17 – 2017-07-18 (×2): 650 mg via RECTAL
  Filled 2017-07-17 (×2): qty 1

## 2017-07-17 MED ORDER — PIPERACILLIN-TAZOBACTAM 3.375 G IVPB: 3.3750 g | Freq: Three times a day (TID) | INTRAVENOUS | Status: DC

## 2017-07-17 NOTE — Progress Notes (Signed)
Pharmacy Antibiotic Note  Nancy Montgomery is a 84 y.o. female admitted on 07/17/2017 with sepsis.  Pharmacy has been consulted for vanc/zosyn dosing.  Plan: Patient received vanc 1g and zosyn 3.375g IV x 1 in ED  Will continue vanc 1g IV q24h w/ 8 hrs stack Will draw vanc trough 03/04 @ 1000 prior to 4th dose. Will continue zosyn 3.375g IV q8h  Ke 0.0371 T1/2 18 ~ 24 hrs Goal trough 15 - 20 mcg/mL  Height: 5\' 2"  (157.5 cm) Weight: 125 lb (56.7 kg) IBW/kg (Calculated) : 50.1  Temp (24hrs), Avg:99.5 F (37.5 C), Min:99.2 F (37.3 C), Max:99.7 F (37.6 C)  Recent Labs  Lab 07/17/17 0217 07/17/17 0453  WBC 3.4*  --   CREATININE 0.84  --   LATICACIDVEN 3.8* 4.5*    Estimated Creatinine Clearance: 39.4 mL/min (by C-G formula based on SCr of 0.84 mg/dL).    No Known Allergies  Thank you for allowing pharmacy to be a part of this patient's care.  Thomasene Rippleavid Melani Brisbane, PharmD, BCPS Clinical Pharmacist 07/17/2017

## 2017-07-17 NOTE — ED Notes (Signed)
Pt transferred to CCU by myself and respiratory

## 2017-07-17 NOTE — Progress Notes (Signed)
CODE SEPSIS - PHARMACY COMMUNICATION  **Broad Spectrum Antibiotics should be administered within 1 hour of Sepsis diagnosis**  Time Code Sepsis Called/Page Received: n/a (times not appearing correct on pager)  Antibiotics Ordered: vanc/zosyn  Time of 1st antibiotic administration: 0303  Additional action taken by pharmacy:   If necessary, Name of Provider/Nurse Contacted:     Thomasene Rippleavid  Geraldina Parrott ,PharmD Clinical Pharmacist  07/17/2017  4:27 AM

## 2017-07-17 NOTE — H&P (Signed)
[facility] [place] [facility]   PATIENT NAME: Nancy Montgomery    MR#:  [id number]  DATE OF BIRTH:  Nov 11, 1930  DATE OF ADMISSION:  07/17/2017  PRIMARY CARE PHYSICIAN: Precious Reel, NP (Inactive)   REQUESTING/REFERRING PHYSICIAN:   CHIEF COMPLAINT:   Chief Complaint  Patient presents with  . Respiratory Distress    HISTORY OF PRESENT ILLNESS: Nancy Montgomery  is a 84 y.o. female with a known history per below presenting from nursing home with acute respiratory failure/hypoxia, suspected to be related to aspiration, patient was also noted to have diarrhea while there, ER workup noted for tachycardia, tachypnea, hypoxia, chest x-ray noted for bilateral pneumonia, urinalysis positive concerning for UTI, patient placed on BiPAP for acute respiratory failure, patient evaluated emergency room with multiple family members at the bedside, patient is unresponsive/poor historian, patient now been admitted for acute hypoxic respiratory failure secondary to sepsis due to aspiration pneumonia/UTI.  PAST MEDICAL HISTORY:   Past Medical History:  Diagnosis Date  . Alzheimer disease   . Diverticulosis   . Hypertension   . Prediabetes   . Renal disorder     PAST SURGICAL HISTORY:  Past Surgical History:  Procedure Laterality Date  . NO PAST SURGERIES    None  SOCIAL HISTORY:  Social History   Tobacco Use  . Smoking status: Never Smoker  . Smokeless tobacco: Never Used  Substance Use Topics  . Alcohol use: No    FAMILY HISTORY:  Family History  Problem Relation Age of Onset  . Hypertension Other     DRUG ALLERGIES: NKDA  REVIEW OF SYSTEMS: Unable to be obtained given encephalopathy  MEDICATIONS AT HOME:  Prior to Admission medications   Medication Sig Start Date End Date Taking? Authorizing Provider  acetaminophen (TYLENOL) 325 MG tablet Take 325-650 mg by mouth every 8 (eight) hours as needed for fever.    Yes [provider]  amLODipine (NORVASC) 5 MG  tablet Take 5 mg by mouth daily.   Yes [provider]  Cholecalciferol (VITAMIN D-3) 1000 units CAPS Take 1,000 Units by mouth daily.   Yes [provider]  memantine (NAMENDA) 10 MG tablet Take 10 mg by mouth daily.   Yes [provider]  promethazine (PHENERGAN) 25 MG/ML injection Inject 12.5 mg into the vein every 4 (four) hours as needed for nausea or vomiting. (X 2)   Yes [provider]  acetaminophen (TYLENOL) 100 MG/ML solution Take 500 mg by mouth every 6 (six) hours as needed for fever.    [provider]  dextromethorphan-guaiFENesin (MUCINEX DM) 30-600 MG 12hr tablet Take 1 tablet by mouth every 12 (twelve) hours as needed for cough.    [provider]  loperamide (IMODIUM) 1 MG/5ML solution Take 2 mg by mouth as needed for diarrhea or loose stools. Maximum 5 doses per 24 hours    [provider]  magnesium hydroxide (MILK OF MAGNESIA) 400 MG/5ML suspension Take 30 mLs by mouth daily as needed for mild constipation.    [provider]  ondansetron (ZOFRAN-ODT) 4 MG disintegrating tablet Take 4 mg by mouth every 8 (eight) hours as needed for nausea or vomiting.    [provider]  promethazine (PHENERGAN) 12.5 MG suppository Place 12.5 mg rectally every 6 (six) hours as needed for nausea or vomiting.    [provider]      PHYSICAL EXAMINATION:   VITAL SIGNS: Blood pressure 140/90, pulse 95, temperature 99.7 F (37.6 C), temperature  source Axillary, resp. rate (!) 28, height 5\' 2"  (1.575 m), weight 56.7 kg (125 lb), SpO2 100 %.  GENERAL:  84 y.o.-year-old patient lying in the bed with no acute distress.  Frail-appearing EYES: Pupils equal, round, reactive to light and accommodation. No scleral icterus. Extraocular muscles intact.  HEENT: Head atraumatic, normocephalic. Oropharynx and nasopharynx clear.  NECK:  Supple, no jugular venous distention. No thyroid enlargement, no tenderness.  LUNGS:  Bilateral rhonchi with diminished breath sounds, labored breathing, on BiPAP   CARDIOVASCULAR: S1, S2 normal. No murmurs, rubs, or gallops.  ABDOMEN: Soft, nontender, nondistended. Bowel sounds present. No organomegaly or mass.  EXTREMITIES: No pedal edema, cyanosis, or clubbing.  NEUROLOGIC: PERRL, MAES. Gait not checked.  PSYCHIATRIC: The patient is obtunded SKIN: No obvious rash, lesion, or ulcer.   LABORATORY PANEL:   CBC Recent Labs  Lab 07/17/17 0217  WBC 3.4*  HGB 15.4  HCT 46.5  PLT 164  MCV 88.8  MCH 29.3  MCHC 33.1  RDW 14.6*  LYMPHSABS 1.1  MONOABS 0.1*  EOSABS 0.0  BASOSABS 0.0   ------------------------------------------------------------------------------------------------------------------  Chemistries  Recent Labs  Lab 07/17/17 0217  NA 140  K 4.0  CL 109  CO2 20*  GLUCOSE 210*  BUN 20  CREATININE 0.84  CALCIUM 8.1*  AST 40  ALT 13*  ALKPHOS 64  BILITOT 0.7   ------------------------------------------------------------------------------------------------------------------ estimated creatinine clearance is 39.4 mL/min (by C-G formula based on SCr of 0.84 mg/dL). ------------------------------------------------------------------------------------------------------------------ No results for input(s): TSH, T4TOTAL, T3FREE, THYROIDAB in the last 72 hours.  Invalid input(s): FREET3   Coagulation profile No results for input(s): INR, PROTIME in the last 168 hours. ------------------------------------------------------------------------------------------------------------------- No results for input(s): DDIMER in the last 72 hours. -------------------------------------------------------------------------------------------------------------------  Cardiac Enzymes No results for input(s): CKMB, TROPONINI, MYOGLOBIN in the last 168 hours.  Invalid input(s):  CK ------------------------------------------------------------------------------------------------------------------ Invalid input(s): POCBNP  ---------------------------------------------------------------------------------------------------------------  Urinalysis    Component Value Date/Time   COLORURINE YELLOW (A) 07/17/2017 0217   APPEARANCEUR HAZY (A) 07/17/2017 0217   LABSPEC 1.015 07/17/2017 0217   PHURINE 5.0 07/17/2017 0217   GLUCOSEU 50 (A) 07/17/2017 0217   HGBUR NEGATIVE 07/17/2017 0217   BILIRUBINUR NEGATIVE 07/17/2017 0217   KETONESUR NEGATIVE 07/17/2017 0217   PROTEINUR NEGATIVE 07/17/2017 0217   NITRITE POSITIVE (A) 07/17/2017 0217   LEUKOCYTESUR NEGATIVE 07/17/2017 0217     RADIOLOGY: Dg Chest Port 1 View  Result Date: 07/17/2017 CLINICAL DATA:  Respiratory distress, altered mental status EXAM: PORTABLE CHEST 1 VIEW COMPARISON:  12/30/2015 FINDINGS: Right perihilar and lower lobe airspace opacities. Left lower lobe opacity. Heart is normal size. No effusions or acute bony abnormality. IMPRESSION: Bilateral airspace opacities, right greater than left. Findings concerning for pneumonia. Electronically Signed   By: Charlett NoseKevin  Dover M.D.   On: 07/17/2017 02:50    EKG: Orders placed or performed during the hospital encounter of 07/17/17  . ED EKG 12-Lead  . ED EKG 12-Lead  . EKG 12-Lead  . EKG 12-Lead    IMPRESSION AND PLAN: 1 acute hypoxic respiratory failure secondary to sepsis, Bilateral aspiration pneumonia, UTI, from nursing home Admit to ICU, intensivist notified, BiPAP with weaning as tolerated, supplemental oxygen, and continue close medical monitoring  2 acute sepsis Secondary to bilateral aspiration pneumonia and UTI Sepsis protocol, empiric vancomycin/cefepime, follow-up on cultures  3 acute aspiration bilateral HCAP Pneumonia protocol, follow-up on cultures  4 acute urinary tract infection Antibiotics per above and follow-up on cultures  5  acute diarrhea Follow-up GI panel  6  acute encephalopathy Most likely secondary to above neurochecks per routine, aspiration/fall precautions while in house    All the records are reviewed and case discussed with ED provider. Management plans discussed with the patient, family and they are in agreement.  CODE STATUS:dnr Code Status History    Date Active Date Inactive Code Status Order ID Comments User Context   12/02/2016 16:13 12/05/2016 18:55 DNR [id number]  Gracelyn Nurse, MD Inpatient   12/30/2015 13:09 01/01/2016 19:03 Full Code [id number]  Hower, Cletis [place], MD ED    Questions for Most Recent Historical Code Status (Order [id number])    Question Answer Comment   In the event of cardiac or respiratory ARREST Do not call a "code blue"    In the event of cardiac or respiratory ARREST Do not perform Intubation, CPR, defibrillation or ACLS    In the event of cardiac or respiratory ARREST Use medication by any route, position, wound care, and other measures to relive pain and suffering. May use oxygen, suction and manual treatment of airway obstruction as needed for comfort.        TOTAL TIME TAKING CARE OF THIS PATIENT: 45 minutes.    Evelena Asa Deneka Greenwalt M.D on 07/17/2017   Between 7am to 6pm - Pager - [phone]  After 6pm go to [email or web address] - password [email or web address]  [facility]  Office  [phone]  CC: Primary care physician; Precious Reel, NP (Inactive)   Note: This dictation was prepared with Dragon dictation along with smaller phrase technology. Any transcriptional errors that result from this process are unintentional.

## 2017-07-17 NOTE — Consult Note (Signed)
PULMONARY / CRITICAL CARE MEDICINE   Name: Nancy Montgomery MRN: [id number] DOB: 08/11/1930    ADMISSION DATE:  07/17/2017  PT PROFILE:   [age] F SNF resident with advanced dementia admitted via ED with acute hypoxemic respiratory failure due to likely PNA, suspected aspiration. Intermittent BIPAP. DNR/DNI  MAJOR EVENTS/TEST RESULTS: 07/17/17 admission via ED from SNF with diagnosis of pneumonia and diarrhea (neurovirus positive)  INDWELLING DEVICES::   MICRO DATA: C. difficile PCR 03/01 >> NEG GI panel 03/01 >> POS for norovirus MRSA PCR 03/01 >> NEG Blood  >>   ANTIMICROBIALS:  Vanc 03/01 X 1 Pip-tazo 03/01 >>      HPI: Level 5 caveat.  Admission note has been reviewed in detail  PAST MEDICAL HISTORY :  She  has a past medical history of Alzheimer disease, Diverticulosis, Hypertension, Prediabetes, and Renal disorder.  PAST SURGICAL HISTORY: She  has a past surgical history that includes No past surgeries.  No Known Allergies  No current facility-administered medications on file prior to encounter.    Current Outpatient Medications on File Prior to Encounter  Medication Sig  . acetaminophen (TYLENOL) 325 MG tablet Take 325-650 mg by mouth every 8 (eight) hours as needed for fever.   Marland Kitchen. amLODipine (NORVASC) 5 MG tablet Take 5 mg by mouth daily.  . Cholecalciferol (VITAMIN D-3) 1000 units CAPS Take 1,000 Units by mouth daily.  . memantine (NAMENDA) 10 MG tablet Take 10 mg by mouth daily.  . promethazine (PHENERGAN) 25 MG/ML injection Inject 12.5 mg into the vein every 4 (four) hours as needed for nausea or vomiting. (X 2)  . acetaminophen (TYLENOL) 100 MG/ML solution Take 500 mg by mouth every 6 (six) hours as needed for fever.  Marland Kitchen. dextromethorphan-guaiFENesin (MUCINEX DM) 30-600 MG 12hr tablet Take 1 tablet by mouth every 12 (twelve) hours as needed for cough.  . loperamide (IMODIUM) 1 MG/5ML solution Take 2 mg by mouth as needed for diarrhea or loose stools. Maximum 5 doses  per 24 hours  . magnesium hydroxide (MILK OF MAGNESIA) 400 MG/5ML suspension Take 30 mLs by mouth daily as needed for mild constipation.  . ondansetron (ZOFRAN-ODT) 4 MG disintegrating tablet Take 4 mg by mouth every 8 (eight) hours as needed for nausea or vomiting.  . promethazine (PHENERGAN) 12.5 MG suppository Place 12.5 mg rectally every 6 (six) hours as needed for nausea or vomiting.    FAMILY HISTORY:  Her indicated that the status of her other is unknown.   SOCIAL HISTORY: She  reports that  has never smoked. she has never used smokeless tobacco. She reports that she does not drink alcohol or use drugs.  REVIEW OF SYSTEMS:   Level 5 caveat  SUBJECTIVE:    VITAL SIGNS: BP (!) 91/59   Pulse 96   Temp 100.1 F (37.8 C) (Axillary)   Resp (!) 26   Ht 5\' 2"  (1.575 m)   Wt 56.7 kg (125 lb)   SpO2 93%   BMI 22.86 kg/m   HEMODYNAMICS:    VENTILATOR SETTINGS: FiO2 (%):  [50 %] 50 %  INTAKE / OUTPUT: No intake/output data recorded.  PHYSICAL EXAMINATION: General: Presently off BiPAP, rattling respiratory sounds, RASS -3 Neuro: Somnolent, no focal deficits, CNs intact HEENT: NCAT, sclerae white Cardiovascular: Regular, no M Lungs: Diffuse bilateral rhonchi, no wheezes Abdomen: Soft, NT, diminished bowel sounds Extremities: Warm, no edema Skin: No lesions noted LABS:  BMET Recent Labs  Lab 07/17/17 0217 07/17/17 0843  NA 140 142  K 4.0 2.7*  CL 109 110  CO2 20* 22  BUN 20 19  CREATININE 0.84 1.20*  GLUCOSE 210* 143*    Electrolytes Recent Labs  Lab 07/17/17 0217 07/17/17 0843  CALCIUM 8.1* 8.1*    CBC Recent Labs  Lab 07/17/17 0217 07/17/17 0843  WBC 3.4* 1.7*  HGB 15.4 14.1  HCT 46.5 44.1  PLT 164 141*    Coag's Recent Labs  Lab 07/17/17 0843  APTT 25  INR 1.09    Sepsis Markers Recent Labs  Lab 07/17/17 0217 07/17/17 0453 07/17/17 0843  LATICACIDVEN 3.8* 4.5* 5.2*  PROCALCITON  --   --  8.21    ABG No results for  input(s): PHART, PCO2ART, PO2ART in the last 168 hours.  Liver Enzymes Recent Labs  Lab 07/17/17 0217 07/17/17 0843  AST 40 52*  ALT 13* 13*  ALKPHOS 64 48  BILITOT 0.7 1.0  ALBUMIN 3.5 3.2*    Cardiac Enzymes No results for input(s): TROPONINI, PROBNP in the last 168 hours.  Glucose Recent Labs  Lab 07/17/17 0810  GLUCAP 143*    CXR: Bilateral lower lobe infiltrates   ASSESSMENT / PLAN: Acute hypoxemic respiratory failure Bilateral lower lobe pneumonia -possible aspiration pneumonia Impaired cough mechanics due to altered level of consciousness AKI Hypokalemia Neurovirus enteritis Advanced dementia Acute encephalopathy DNR/DNI  Continue supplemental oxygen Continue BiPAP as needed  NTS as needed Monitor BMET intermittently Monitor I/Os Correct electrolytes as indicated  DVT px: Enoxaparin Monitor CBC intermittently Transfuse per usual guidelines  Monitor temp, WBC count Micro and abx as above  Minimize sedatives/analgesics Discussed with patient's family and confirmed DNR status.  All are in agreement that her comfort and dignity are the highest priorities    Billy Fischer, MD PCCM service Mobile [phone] Pager [phone] 07/17/2017 3:21 PM

## 2017-07-17 NOTE — ED Triage Notes (Signed)
EMS called out for difficulty breathing. O2 sat 90% on arrivail. On 6L for them in route 91%. Pt vomitting and with diarrhea. All lung fields congested with rhonchi. They suspect pt aspirated. Per facility pt is normally alert. Pt arrives in fetal position not responding to any caregivers. Pt turned and cleaned of diahrrea and emesis without any response. EMS states family is on the way

## 2017-07-17 NOTE — Clinical Social Work Note (Signed)
Clinical Social Work Assessment  Patient Details  Name: Nancy Montgomery MRN: [id number] Date of Birth: 04/08/1931  Date of referral:  07/17/17               Reason for consult:  Discharge Planning                Permission sought to share information with:  Case Manager, Facility Medical sales representativeContact Representative, Family Supports Permission granted to share information::     Name::        Agency::  [facility], (patient is also a [facility])  Relationship::     Contact Information:     Housing/Transportation Living arrangements for the past 2 months:  Skilled Building surveyorursing Facility Source of Information:  Medical Team, Facility, Adult Children Patient Interpreter Needed:  None Criminal Activity/Legal Involvement Pertinent to Current Situation/Hospitalization:  No - Comment as needed Significant Relationships:  Adult Children, Merchandiser, retailCommunity Support, Other Family Members Lives with:  Facility Resident Do you feel safe going back to the place where you live?  Yes Need for family participation in patient care:  Yes (Comment)  Care giving concerns:  Nancy Montgomery  is a 84 y.o. female with a known history per below presenting from nursing home with acute respiratory failure/hypoxia, suspected to be related to aspiration, patient was also noted to have diarrhea while there, ER workup noted for tachycardia, tachypnea, hypoxia, chest x-ray noted for bilateral pneumonia, urinalysis positive concerning for UTI, patient placed on BiPAP for acute respiratory failure,   Patient is a [facility] member, but lives at [facility] and plans to return. Spoke with Nancy Montgomery at facility who is in agreement with plan and will return. Multiple family members at bedside.   Social Worker assessment / plan:  Assessment completed. [facility] will [facility] at discharge. FL2 to be updated closer to DC and when patient more stable. Will follow acutely as patient progresses medically and will assist with  transitions.   Employment status:  Retired Health and safety inspectornsurance information:  Other (Comment Required)(PACE) PT Recommendations:  Not assessed at this time Information / Referral to community resources:     Patient/Family's Response to care:  Understanding and agreeable  Patient/Family's Understanding of and Emotional Response to Diagnosis, Current Treatment, and Prognosis:  Family aware of severity of issues and medical complexities. They have been in touch with [facility].  Emotional Assessment Appearance:  Appears stated age Attitude/Demeanor/Rapport:  Unable to Assess Affect (typically observed):  Unable to Assess Orientation:  Oriented to Self Alcohol / Substance use:  Not Applicable Psych involvement (Current and /or in the community):  No (Comment)  Discharge Needs  Concerns to be addressed:  Care Coordination Readmission within the last 30 days:  No Current discharge risk:  None Barriers to Discharge:  Continued Medical Work up   Raye SorrowCoble, Chipper Koudelka N, LCSW 07/17/2017, 10:41 AM

## 2017-07-17 NOTE — Progress Notes (Signed)
[employer] [facility] [place] [facility]   PATIENT NAME: Nancy Montgomery    MR#:  [id number]  DATE OF BIRTH:  01/09/1931  SUBJECTIVE:   patient sent to ICU on bipapa  REVIEW OF SYSTEMS:    Unable to obtain patient on BiPAP of dementia   Tolerating Diet: npo      DRUG ALLERGIES:  No Known Allergies  VITALS:  Blood pressure (!) 156/92, pulse (!) 101, temperature 99.1 F (37.3 C), temperature source Axillary, resp. rate (!) 23, height 5\' 2"  (1.575 m), weight 56.7 kg (125 lb), SpO2 95 %.  PHYSICAL EXAMINATION:  Constitutional: Appears thin and frail wearing BiPAP critically ill-appearing HENT: Normocephalic. .   Eyes: Conjunctivae and EOM are normal. PERRLA, no scleral icterus.  Neck: Normal ROM. Neck supple. No JVD. No tracheal deviation. CVS: RRR, S1/S2 +, no murmurs, no gallops, no carotid bruit.  Pulmonary: Bilateral clear without wheezing or crackles labored breathing currently on BiPAP  Abdominal: Soft. BS +,  no distension, tenderness, rebound or guarding.  Musculoskeletal: Normal range of motion. No edema and no tenderness.  Neuro: Lethargic. Skin: Skin is warm and dry. No rash noted. Psychiatric: Dementia/lethargic     LABORATORY PANEL:   CBC Recent Labs  Lab 07/17/17 0217  WBC 3.4*  HGB 15.4  HCT 46.5  PLT 164   ------------------------------------------------------------------------------------------------------------------  Chemistries  Recent Labs  Lab 07/17/17 0217  NA 140  K 4.0  CL 109  CO2 20*  GLUCOSE 210*  BUN 20  CREATININE 0.84  CALCIUM 8.1*  AST 40  ALT 13*  ALKPHOS 64  BILITOT 0.7   ------------------------------------------------------------------------------------------------------------------  Cardiac Enzymes No results for input(s): TROPONINI in the last 168 hours. ------------------------------------------------------------------------------------------------------------------  RADIOLOGY:  Dg Chest Port 1  View  Result Date: 07/17/2017 CLINICAL DATA:  Respiratory distress, altered mental status EXAM: PORTABLE CHEST 1 VIEW COMPARISON:  12/30/2015 FINDINGS: Right perihilar and lower lobe airspace opacities. Left lower lobe opacity. Heart is normal size. No effusions or acute bony abnormality. IMPRESSION: Bilateral airspace opacities, right greater than left. Findings concerning for pneumonia. Electronically Signed   By: Charlett NoseKevin  Dover M.D.   On: 07/17/2017 02:50     ASSESSMENT AND PLAN:    84 year old female with history of dementia who presents from nursing home with respiratory distress.  1. Acute hypoxic respiratory failure due to sepsis and bilateral aspiration pneumonia Continue BiPAP and wean to nasal cannula as tolerated Follow-up on intensivist consult  2. Sepsis: Patient presents with tachypnea, tachycardia and leukocytosis Sepsis is due to bilateral aspiration pneumonia and urinary tract infection Continue vancomycin/Zosyn  Follow-up on final cultures Continue to monitor lactic acid   3. Aspiration pneumonia: Change and about except vancomycin and Zosyn Follow-up on MRSA PCR, if negative may discontinue vancomycin  4. Norovirus on GI panel: supportive care  5. UTI: Continue Zosyn and follow up on cultures   6. Acute encephalopathy in the setting of acute hypoxic respiratory failure with sepsis on top of underlying dementia:  Monitor  Continue Namenda for dementia   7. Essential hypertension: Continue Norvasc    CODE STATUS: DNR  TOTAL TIME TAKING CARE OF THIS PATIENT: 30 minutes.    Needs palliative care consult  POSSIBLE D/C 2-4 days, DEPENDING ON CLINICAL CONDITION.   Dyshaun Bonzo M.D on 07/17/2017 at 8:36 AM  Between 7am to 6pm - Pager - [phone] After 6pm go to [email or web address] - Social research officer, governmentpassword [email or web address]  [facility]  Office  [phone]  CC: Primary care physician;  Precious Reel, NP (Inactive)  Note: This dictation was prepared with Dragon  dictation along with smaller phrase technology. Any transcriptional errors that result from this process are unintentional.

## 2017-07-17 NOTE — Progress Notes (Signed)
Pharmacy Antibiotic Note  Kym Groomnnie M Goodie is a 84 y.o. female admitted on 07/17/2017 with aspiration pneumonia.  Pharmacy has been consulted for Zosyn dosing. Vancomycin discontinued.   Plan: Zosyn 3.375g IV Q8hr.   Height: 5\' 2"  (157.5 cm) Weight: 125 lb (56.7 kg) IBW/kg (Calculated) : 50.1  Temp (24hrs), Avg:99.3 F (37.4 C), Min:99.1 F (37.3 C), Max:99.7 F (37.6 C)  Recent Labs  Lab 07/17/17 0217 07/17/17 0453 07/17/17 0843  WBC 3.4*  --  1.7*  CREATININE 0.84  --   --   LATICACIDVEN 3.8* 4.5* 5.2*    Estimated Creatinine Clearance: 39.4 mL/min (by C-G formula based on SCr of 0.84 mg/dL).    No Known Allergies  Antimicrobials this admission: Vancomycin 3/1 x 1  Zosyn 3/1 >>   Dose adjustments this admission: N/A  Microbiology results: 3/1 BCx: no growth < 12 hours  3/1 CDiff: negative  3/1 GI Panel: norovirus  3/1 MRSA PCR: pending   Thank you for allowing pharmacy to be a part of this patient's care.  Rollan Roger L 07/17/2017 9:43 AM

## 2017-07-17 NOTE — ED Provider Notes (Addendum)
[facility] Emergency Department Provider Note   Time seen 2:05 AM  I have reviewed the triage vital signs and the nursing notes.  Level 5 caveat: History and physical exam limited secondary to Alzheimer's dementia and respiratory distress HISTORY  Chief Complaint No chief complaint on file.    HPI Nancy Montgomery is a 84 y.o. female with below list of chronic medical conditions presents to the emergency department via EMS with history of vomiting and diarrhea which began at midnight with noted difficulty breathing shortly before arrival per the nursing staff where the patient resides.   Past Medical History:  Diagnosis Date  . Alzheimer disease   . Diverticulosis   . Hypertension   . Prediabetes   . Renal disorder     Patient Active Problem List   Diagnosis Date Noted  . Seizure ([place]) 12/02/2016  . Acute delirium 12/30/2015  . Metabolic encephalopathy 03/54/6568    Past Surgical History:  Procedure Laterality Date  . NO PAST SURGERIES      Prior to Admission medications   Medication Sig Start Date End Date Taking? Authorizing Provider  acetaminophen (TYLENOL) 100 MG/ML solution Take 500 mg by mouth every 6 (six) hours as needed for fever.    [provider]  acetaminophen (TYLENOL) 325 MG tablet Take 325-650 mg by mouth every 8 (eight) hours as needed for fever.     [provider]  amLODipine (NORVASC) 5 MG tablet Take 5 mg by mouth daily.    [provider]  Cholecalciferol (VITAMIN D-3) 1000 units CAPS Take 1,000 Units by mouth daily.    [provider]  dextromethorphan-guaiFENesin (MUCINEX DM) 30-600 MG 12hr tablet Take 1 tablet by mouth every 12 (twelve) hours as needed for cough.    [provider]  levETIRAcetam (KEPPRA) 100 MG/ML solution Take 5 mLs (500 mg total) by mouth 2 (two) times daily. Patient not taking: Reported on 05/08/2017 12/05/16   Epifanio Lesches, MD  linezolid (ZYVOX)  100 MG/5ML suspension Take 30 mLs (600 mg total) by mouth every 12 (twelve) hours. Patient not taking: Reported on 05/08/2017 12/05/16   Epifanio Lesches, MD  loperamide (IMODIUM) 1 MG/5ML solution Take 2 mg by mouth as needed for diarrhea or loose stools. Maximum 5 doses per 24 hours    [provider]  magnesium hydroxide (MILK OF MAGNESIA) 400 MG/5ML suspension Take 30 mLs by mouth daily as needed for mild constipation.    [provider]  memantine (NAMENDA) 5 MG tablet Take 10 mg by mouth daily.    [provider]  ondansetron (ZOFRAN-ODT) 4 MG disintegrating tablet Take 4 mg by mouth every 8 (eight) hours as needed for nausea or vomiting.    [provider]  promethazine (PHENERGAN) 12.5 MG suppository Place 12.5 mg rectally every 6 (six) hours as needed for nausea or vomiting.    [provider]    Allergies No known drug allergies  Family History  Problem Relation Age of Onset  . Hypertension Other     Social History Social History   Tobacco Use  . Smoking status: Never Smoker  . Smokeless tobacco: Never Used  Substance Use Topics  . Alcohol use: No  . Drug use: No    Review of Systems Constitutional: No fever/chills Eyes: No visual changes. ENT: No sore throat. Cardiovascular: Denies chest pain. Respiratory: Positive for respiratory distress Gastrointestinal: No abdominal pain.  Positive for vomiting and diarrhea  genitourinary: Negative for dysuria. Musculoskeletal:  Negative for neck pain.  Negative for back pain. Integumentary: Negative for rash. Neurological: Negative for headaches, focal weakness or numbness.   ____________________________________________   PHYSICAL EXAM:  VITAL SIGNS: ED Triage Vitals  Enc Vitals Group     BP      Pulse      Resp      Temp      Temp src      SpO2      Weight      Height      Head Circumference      Peak Flow      Pain Score      Pain Loc      Pain Edu?       Excl. in [place]?     Constitutional: Alert and apparent respiratory distress Eyes: Conjunctivae are normal.  Head: Atraumatic.Marland Kitchen Mouth/Throat: Mucous membranes are moist.  Oropharynx non-erythematous. Neck: No stridor.   Cardiovascular: Tachycardia regular rhythm. Good peripheral circulation. Grossly normal heart sounds. Respiratory: Tachypnea, positive accessory respiratory muscle use, diffuse rhonchi markedly in the right lung base  gastrointestinal: Soft and nontender. No distention.  Musculoskeletal: No lower extremity tenderness nor edema. No gross deformities of extremities. Neurologic:   No gross focal neurologic deficits are appreciated.  Skin:  Skin is warm, dry and intact. No rash noted.   ____________________________________________   LABS (all labs ordered are listed, but only abnormal results are displayed)  Labs Reviewed  CULTURE, BLOOD (ROUTINE X 2)  CULTURE, BLOOD (ROUTINE X 2)  C DIFFICILE QUICK SCREEN W PCR REFLEX  GASTROINTESTINAL PANEL BY PCR, STOOL (REPLACES STOOL CULTURE)  COMPREHENSIVE METABOLIC PANEL  CBC WITH DIFFERENTIAL/PLATELET  URINALYSIS, ROUTINE W REFLEX MICROSCOPIC  LACTIC ACID, PLASMA  LACTIC ACID, PLASMA  BLOOD GAS, VENOUS   ____________________________________________  EKG  ED ECG REPORT I, Maxton N BROWN, the attending physician, personally viewed and interpreted this ECG.   Date: 07/17/2017  EKG Time: 2:33 AM  Rate: 125  Rhythm: Sinus tachycardia  Axis: Normal  Intervals: Normal  ST&T Change: None  ____________________________________________  RADIOLOGY I, [place] N BROWN, personally viewed and evaluated these images (plain radiographs) as part of my medical decision making, as well as reviewing the written report by the radiologist.  ED MD interpretation: Bilateral airspace opacities concerning for pneumonia.  Official radiology report(s): Dg Chest Port 1 View  Result Date: 07/17/2017 CLINICAL DATA:  Respiratory distress,  altered mental status EXAM: PORTABLE CHEST 1 VIEW COMPARISON:  12/30/2015 FINDINGS: Right perihilar and lower lobe airspace opacities. Left lower lobe opacity. Heart is normal size. No effusions or acute bony abnormality. IMPRESSION: Bilateral airspace opacities, right greater than left. Findings concerning for pneumonia. Electronically Signed   By: Rolm Baptise M.D.   On: 07/17/2017 02:50    ____________________________________________    .Critical Care Performed by: Gregor Hams, MD Authorized by: Gregor Hams, MD   Critical care provider statement:    Critical care time (minutes):  60   Critical care start time:  07/17/2017 2:05 AM   Critical care end time:  07/17/2017 3:05 AM   Critical care time was exclusive of:  Separately billable procedures and treating other patients   Critical care was necessary to treat or prevent imminent or life-threatening deterioration of the following conditions:  Respiratory failure, sepsis and circulatory failure   Critical care was time spent personally by me on the following activities:  Development of treatment plan with patient or surrogate, discussions with consultants, evaluation of patient's response to  treatment, examination of patient, obtaining history from patient or surrogate, ordering and performing treatments and interventions, ordering and review of laboratory studies, ordering and review of radiographic studies, pulse oximetry, re-evaluation of patient's condition and review of old charts   I assumed direction of critical care for this patient from another provider in my specialty: no        ____________________________________________   INITIAL IMPRESSION / ASSESSMENT AND PLAN / ED COURSE  As part of my medical decision making, I reviewed the following data within the electronic MEDICAL RECORD NUMBER   84 year old female present with above-stated history and physical exam concerning for aspiration pneumonia and infectious diarrhea.   Patient met sepsis criteria on arrival and as such sepsis protocol initiated with patient receiving broad-spectrum antibiotics.  Laboratory data notable for lactic acid of 3.8 white blood cell count of 3.4.  Chest x-ray consistent with multilobar pneumonia.  Laboratory data also notable for positive Norovirus.  Patient discussed with Dr. Jerelyn Charles for hospital admission for further evaluation and management of sepsis multilobar pneumonia and neurovirus enteritis     ____________________________________________  FINAL CLINICAL IMPRESSION(S) / ED DIAGNOSES  Final diagnoses:  Sepsis, due to unspecified organism ([place])  Aspiration pneumonia of right lower lobe, unspecified aspiration pneumonia type ([place])  Enteritis due to Norovirus     MEDICATIONS GIVEN DURING THIS VISIT:  Medications  piperacillin-tazobactam (ZOSYN) IVPB 3.375 g (3.375 g Intravenous New Bag/Given 07/17/17 0303)  vancomycin (VANCOCIN) IVPB 1000 mg/200 mL premix (1,000 mg Intravenous New Bag/Given 07/17/17 0303)  ondansetron (ZOFRAN) injection 4 mg (not administered)  ondansetron (ZOFRAN) 4 MG/2ML injection (  Given 07/17/17 0302)     ED Discharge Orders    None       Note:  This document was prepared using Dragon voice recognition software and may include unintentional dictation errors.    Gregor Hams, MD 07/17/17 8004    Gregor Hams, MD 07/31/17 [phone]

## 2017-07-17 NOTE — Progress Notes (Signed)
Chaplain rounded ICU and was referred to Pt. Chaplain introduced himself.    07/17/17 1100  Clinical Encounter Type  Visited With Family  Visit Type Initial  Referral From Nurse  Spiritual Encounters  Spiritual Needs Emotional

## 2017-07-17 NOTE — Progress Notes (Signed)
Pharmacy Lovenox Dosing  84 y.o. female admitted with Respiratory Distress . Patient ordered Lovenox 40 mg daily for VTE prophylaxis.   Filed Weights   07/17/17 0433  Weight: 125 lb (56.7 kg)    Body mass index is 22.86 kg/m.  Estimated Creatinine Clearance: 27.6 mL/min (A) (by C-G formula based on SCr of 1.2 mg/dL (H)).  Will adjust Lovenox dosing to 30 mg daily due to renal insufficiency.  Nancy Montgomery, Nancy Montgomery 07/17/2017 3:25 PM

## 2017-07-17 NOTE — ED Notes (Signed)
Call to ICU to give report. Told they wont be taking her this shift and will call house coverage. Flow Manager aware

## 2017-07-18 ENCOUNTER — Inpatient Hospital Stay: Payer: Medicare (Managed Care)

## 2017-07-18 DIAGNOSIS — J189 Pneumonia, unspecified organism: Secondary | ICD-10-CM

## 2017-07-18 LAB — COMPREHENSIVE METABOLIC PANEL
ALBUMIN: 2.5 g/dL — AB (ref 3.5–5.0)
ALK PHOS: 31 U/L — AB (ref 38–126)
ALT: 12 U/L — AB (ref 14–54)
ANION GAP: 8 (ref 5–15)
AST: 33 U/L (ref 15–41)
BUN: 31 mg/dL — ABNORMAL HIGH (ref 6–20)
CALCIUM: 7.7 mg/dL — AB (ref 8.9–10.3)
CO2: 21 mmol/L — AB (ref 22–32)
Chloride: 115 mmol/L — ABNORMAL HIGH (ref 101–111)
Creatinine, Ser: 1.43 mg/dL — ABNORMAL HIGH (ref 0.44–1.00)
GFR calc non Af Amer: 33 mL/min — ABNORMAL LOW (ref >60)
GFR, EST AFRICAN AMERICAN: 38 mL/min — AB (ref >60)
GLUCOSE: 92 mg/dL (ref 65–99)
Potassium: 4.4 mmol/L (ref 3.5–5.1)
Sodium: 144 mmol/L (ref 135–145)
Total Bilirubin: 0.9 mg/dL (ref 0.3–1.2)
Total Protein: 6.1 g/dL — ABNORMAL LOW (ref 6.5–8.1)

## 2017-07-18 LAB — HIV ANTIBODY (ROUTINE TESTING W REFLEX): HIV Screen 4th Generation wRfx: NONREACTIVE

## 2017-07-18 LAB — CBC
HCT: 37.8 % (ref 35.0–47.0)
HEMOGLOBIN: 12.3 g/dL (ref 12.0–16.0)
MCH: 28.8 pg (ref 26.0–34.0)
MCHC: 32.4 g/dL (ref 32.0–36.0)
MCV: 89 fL (ref 80.0–100.0)
Platelets: 111 10*3/uL — ABNORMAL LOW (ref 150–440)
RBC: 4.25 MIL/uL (ref 3.80–5.20)
RDW: 14.2 % (ref 11.5–14.5)
WBC: 6.3 10*3/uL (ref 3.6–11.0)

## 2017-07-18 MED ORDER — DEXTROSE 5 % IV SOLN
INTRAVENOUS | Status: AC
Start: 2017-07-18 — End: 2017-07-19
  Administered 2017-07-18: 50 mL via INTRAVENOUS

## 2017-07-18 MED ORDER — SODIUM CHLORIDE 0.9 % IV SOLN
0.0000 ug/min | INTRAVENOUS | Status: DC
  Administered 2017-07-18 (×2): 20 ug/min via INTRAVENOUS
  Filled 2017-07-18: qty 10
  Filled 2017-07-18: qty 1

## 2017-07-18 MED ORDER — SODIUM CHLORIDE 0.9 % IV BOLUS (SEPSIS)
500.0000 mL | Freq: Once | INTRAVENOUS | Status: AC
  Administered 2017-07-18: 500 mL via INTRAVENOUS

## 2017-07-18 NOTE — Progress Notes (Signed)
Report given to Irving BurtonEmily, RN for pt transfer to room 135 and POA niece called for update.

## 2017-07-18 NOTE — Progress Notes (Signed)
[employer] [facility] [place] at [facility]   PATIENT NAME: Nancy Montgomery    MR#:  [id number]  DATE OF BIRTH:  12/12/1930  SUBJECTIVE:  Patient is weaned off of BiPAP on nasal cannula.   REVIEW OF SYSTEMS:    Unable to obtain patient on BiPAP of dementia   Tolerating Diet: yes      DRUG ALLERGIES:  No Known Allergies  VITALS:  Blood pressure 104/60, pulse 93, temperature 99.5 F (37.5 C), resp. rate (!) 24, height 5\' 2"  (1.575 m), weight 56.7 kg (125 lb), SpO2 100 %.  PHYSICAL EXAMINATION:  Constitutional: Appears thin and frail  HENT: Normocephalic. .   Eyes: Conjunctivae and EOM are normal. PERRLA, no scleral icterus.  Neck: Normal ROM. Neck supple. No JVD. No tracheal deviation. CVS: RRR, S1/S2 +, no murmurs, no gallops, no carotid bruit.  Pulmonary: Bilateral wheezing  Abdominal: Soft. BS +,  no distension, tenderness, rebound or guarding.  Musculoskeletal: Normal range of motion. No edema and no tenderness.  Neuro: She is not following commands.  Skin: Skin is warm and dry. No rash noted. Psychiatric: Dementia   LABORATORY PANEL:   CBC Recent Labs  Lab 07/18/17 0517  WBC 6.3  HGB 12.3  HCT 37.8  PLT 111*   ------------------------------------------------------------------------------------------------------------------  Chemistries  Recent Labs  Lab 07/18/17 0517  NA 144  K 4.4  CL 115*  CO2 21*  GLUCOSE 92  BUN 31*  CREATININE 1.43*  CALCIUM 7.7*  AST 33  ALT 12*  ALKPHOS 31*  BILITOT 0.9   ------------------------------------------------------------------------------------------------------------------  Cardiac Enzymes No results for input(s): TROPONINI in the last 168 hours. ------------------------------------------------------------------------------------------------------------------  RADIOLOGY:  Dg Chest Port 1 View  Result Date: 07/18/2017 CLINICAL DATA:  Respiratory failure EXAM: PORTABLE CHEST 1 VIEW COMPARISON:   July 17, 2017 FINDINGS: The patient's chin overlies her upper left chest limiting evaluation. No pneumothorax. The cardiomediastinal silhouette is stable. The opacity seen previously in the right lung base is significantly improved. No other focal infiltrates. No nodules or masses. IMPRESSION: Improving opacity in the right lung base.  No other change. Electronically Signed   By: Gerome Sam III M.D   On: 07/18/2017 07:18   Dg Chest Port 1 View  Result Date: 07/17/2017 CLINICAL DATA:  Respiratory distress, altered mental status EXAM: PORTABLE CHEST 1 VIEW COMPARISON:  12/30/2015 FINDINGS: Right perihilar and lower lobe airspace opacities. Left lower lobe opacity. Heart is normal size. No effusions or acute bony abnormality. IMPRESSION: Bilateral airspace opacities, right greater than left. Findings concerning for pneumonia. Electronically Signed   By: Charlett Nose M.D.   On: 07/17/2017 02:50     ASSESSMENT AND PLAN:    84 year old female with history of dementia who presents from nursing home with respiratory distress.  1. Acute hypoxic respiratory failure due to sepsis and bilateral aspiration pneumonia She has been weaned off BiPAP and on nasal cannula   2. Sepsis: Patient presents with tachypnea, tachycardia and leukocytosis Sepsis is due to bilateral aspiration pneumonia and urinary tract infection Continue Zosyn  Follow-up on final cultures   3. Aspiration pneumonia: Continue Zosyn 4. Norovirus on GI panel: supportive care  5. UTI: Continue Zosyn and follow up on cultures   6. Acute encephalopathy in the setting of acute hypoxic respiratory failure with sepsis on top of underlying dementia:  Monitor  Continue Namenda for dementia   7. Essential hypertension: Continue Norvasc   8. Hypokalemia: Resolved 9. Acute kidney Injury: This is on the setting of  poor po intake and sepsis/ Continue IV fluids and avoid nephrotoxic medications  CODE STATUS: DNR  TOTAL TIME TAKING  CARE OF THIS PATIENT: 24 minutes.  Discussed with Dr. Darrol AngelSimons Transfer to floor today PT consultation  Needs palliative care consult  POSSIBLE D/C 1-2 days, DEPENDING ON CLINICAL CONDITION.   Sanjay Broadfoot M.D on 07/18/2017 at 11:21 AM  Between 7am to 6pm - Pager - [phone] After 6pm go to [email or web address] - password [email or web address]  [facility] [place] [facility]  Office  [phone]  CC: Primary care physician; Precious ReelBlake, Mary, NP (Inactive)  Note: This dictation was prepared with Dragon dictation along with smaller phrase technology. Any transcriptional errors that result from this process are unintentional.

## 2017-07-18 NOTE — Progress Notes (Signed)
PULMONARY / CRITICAL CARE MEDICINE   Name: Kym Groomnnie M Gesell MRN: [id number] DOB: 09/07/1930    ADMISSION DATE:  07/17/2017  PT PROFILE:   [age] F SNF resident with advanced dementia admitted via ED with acute hypoxemic respiratory failure due to likely PNA, suspected aspiration. Intermittent BIPAP. DNR/DNI  MAJOR EVENTS/TEST RESULTS: 07/17/17 admission via ED from SNF with diagnosis of pneumonia and diarrhea (neurovirus positive) 03/02 Off BiPAP. No distress  INDWELLING DEVICES::   MICRO DATA: C. difficile PCR 03/01 >> NEG GI panel 03/01 >> POS for norovirus MRSA PCR 03/01 >> NEG Blood  >>   ANTIMICROBIALS:  Vanc 03/01 X 1 Pip-tazo 03/01 >>      SUBJECTIVE:  Comfortable on nasal cannula oxygen.  Speech is unintelligible.  No family at that side.  Patient is unable to voice any complaints.  VITAL SIGNS: BP 104/60   Pulse 93   Temp 99.5 F (37.5 C)   Resp (!) 24   Ht 5\' 2"  (1.575 m)   Wt 56.7 kg (125 lb)   SpO2 100%   BMI 22.86 kg/m   HEMODYNAMICS:    VENTILATOR SETTINGS:    INTAKE / OUTPUT: I/O last 3 completed shifts: In: 1701.6 [I.V.:1201.6; IV Piggyback:500] Out: 200 [Urine:200]  PHYSICAL EXAMINATION: General: Alert, no distress Neuro: No nerves intact, no focal deficits HEENT: NCAT, sclerae white Cardiovascular: Regular, no M Lungs: Few scattered rhonchi, no wheezes Abdomen: Soft, NT, diminished bowel sounds Extremities: Warm, no edema Skin: No lesions noted LABS:  BMET Recent Labs  Lab 07/17/17 0217 07/17/17 0843 07/18/17 0517  NA 140 142 144  K 4.0 2.7* 4.4  CL 109 110 115*  CO2 20* 22 21*  BUN 20 19 31*  CREATININE 0.84 1.20* 1.43*  GLUCOSE 210* 143* 92    Electrolytes Recent Labs  Lab 07/17/17 0217 07/17/17 0843 07/18/17 0517  CALCIUM 8.1* 8.1* 7.7*    CBC Recent Labs  Lab 07/17/17 0217 07/17/17 0843 07/18/17 0517  WBC 3.4* 1.7* 6.3  HGB 15.4 14.1 12.3  HCT 46.5 44.1 37.8  PLT 164 141* 111*    Coag's Recent Labs  Lab  07/17/17 0843  APTT 25  INR 1.09    Sepsis Markers Recent Labs  Lab 07/17/17 0217 07/17/17 0453 07/17/17 0843  LATICACIDVEN 3.8* 4.5* 5.2*  PROCALCITON  --   --  8.21    ABG No results for input(s): PHART, PCO2ART, PO2ART in the last 168 hours.  Liver Enzymes Recent Labs  Lab 07/17/17 0217 07/17/17 0843 07/18/17 0517  AST 40 52* 33  ALT 13* 13* 12*  ALKPHOS 64 48 31*  BILITOT 0.7 1.0 0.9  ALBUMIN 3.5 3.2* 2.5*    Cardiac Enzymes No results for input(s): TROPONINI, PROBNP in the last 168 hours.  Glucose Recent Labs  Lab 07/17/17 0810  GLUCAP 143*    CXR: Improved aeration, especially in RLL   ASSESSMENT / PLAN: Acute hypoxemic respiratory failure Bilateral lower lobe pneumonia  AKI Hypokalemia, resolved Norovirus enteritis Advanced dementia Acute encephalopathy DNR/DNI  Continue supplemental oxygen Continue BiPAP as needed  Monitor BMET intermittently Monitor I/Os Correct electrolytes as indicated  DVT px: Enoxaparin Monitor CBC intermittently Transfuse per usual guidelines  Monitor temp, WBC count Micro and abx as above  Minimize sedatives/analgesics Transfer to MedSurg floor and PCCM service will sign off.  Recommend 7-day course of antibiotics guided by any positive culture results   Billy Fischeravid Shawnia Vizcarrondo, MD PCCM service Mobile [phone] Pager [phone] 07/18/2017 11:42 AM

## 2017-07-18 NOTE — Progress Notes (Signed)
Pt in no acute distress at this time. VSS. Pt does not appear to be in pain. Multi podus boots applied bilaterally.

## 2017-07-19 ENCOUNTER — Inpatient Hospital Stay: Payer: Medicare (Managed Care)

## 2017-07-19 LAB — CBC
HCT: 36.1 % (ref 35.0–47.0)
Hemoglobin: 11.8 g/dL — ABNORMAL LOW (ref 12.0–16.0)
MCH: 29 pg (ref 26.0–34.0)
MCHC: 32.8 g/dL (ref 32.0–36.0)
MCV: 88.4 fL (ref 80.0–100.0)
PLATELETS: 100 10*3/uL — AB (ref 150–440)
RBC: 4.08 MIL/uL (ref 3.80–5.20)
RDW: 14.6 % — AB (ref 11.5–14.5)
WBC: 6.6 10*3/uL (ref 3.6–11.0)

## 2017-07-19 LAB — COMPREHENSIVE METABOLIC PANEL
ALT: 19 U/L (ref 14–54)
AST: 33 U/L (ref 15–41)
Albumin: 2.6 g/dL — ABNORMAL LOW (ref 3.5–5.0)
Alkaline Phosphatase: 35 U/L — ABNORMAL LOW (ref 38–126)
Anion gap: 6 (ref 5–15)
BUN: 19 mg/dL (ref 6–20)
CHLORIDE: 113 mmol/L — AB (ref 101–111)
CO2: 24 mmol/L (ref 22–32)
Calcium: 8.2 mg/dL — ABNORMAL LOW (ref 8.9–10.3)
Creatinine, Ser: 0.96 mg/dL (ref 0.44–1.00)
GFR, EST NON AFRICAN AMERICAN: 53 mL/min — AB (ref >60)
Glucose, Bld: 90 mg/dL (ref 65–99)
Potassium: 3.6 mmol/L (ref 3.5–5.1)
SODIUM: 143 mmol/L (ref 135–145)
Total Bilirubin: 0.7 mg/dL (ref 0.3–1.2)
Total Protein: 6 g/dL — ABNORMAL LOW (ref 6.5–8.1)

## 2017-07-19 MED ORDER — ENOXAPARIN SODIUM 40 MG/0.4ML ~~LOC~~ SOLN
40.0000 mg | SUBCUTANEOUS | Status: DC
  Administered 2017-07-19 – 2017-07-20 (×2): 40 mg via SUBCUTANEOUS
  Filled 2017-07-19 (×2): qty 0.4

## 2017-07-19 NOTE — Progress Notes (Signed)
[employer] - [place] at [facility]   PATIENT NAME: Nancy Montgomery    MR#:  [id number]  DATE OF BIRTH:  06/28/1930  SUBJECTIVE:  Patient without acute issues overnight Still is non verbal at baseline and does not follow commands at baseline   REVIEW OF SYSTEMS:    Unable to obtain patient dementia  Tolerating Diet: yes      DRUG ALLERGIES:  No Known Allergies  VITALS:  Blood pressure (!) 103/57, pulse 83, temperature 99.9 F (37.7 C), temperature source Axillary, resp. rate (!) 24, height 5\' 2"  (1.575 m), weight 56.7 kg (125 lb), SpO2 95 %.  PHYSICAL EXAMINATION:  Constitutional: Appears thin and frail  HENT: Normocephalic. Eyes: Conjunctivae and EOM are normal. PERRLA, no scleral icterus.  Neck: Normal ROM. Neck supple. No JVD. No tracheal deviation. CVS: RRR, S1/S2 +, no murmurs, no gallops, no carotid bruit.  Pulmonary: Bilateral wheezing/rhonchii Abdominal: Soft. BS +,  no distension, tenderness, rebound or guarding.  Musculoskeletal: Normal range of motion. No edema and no tenderness.  Neuro: She does not follow commands well Skin: Skin is warm and dry. No rash noted. Psychiatric: Dementia   LABORATORY PANEL:   CBC Recent Labs  Lab 07/19/17 0306  WBC 6.6  HGB 11.8*  HCT 36.1  PLT 100*   ------------------------------------------------------------------------------------------------------------------  Chemistries  Recent Labs  Lab 07/19/17 0306  NA 143  K 3.6  CL 113*  CO2 24  GLUCOSE 90  BUN 19  CREATININE 0.96  CALCIUM 8.2*  AST 33  ALT 19  ALKPHOS 35*  BILITOT 0.7   ------------------------------------------------------------------------------------------------------------------  Cardiac Enzymes No results for input(s): TROPONINI in the last 168 hours. ------------------------------------------------------------------------------------------------------------------  RADIOLOGY:  Dg Chest Port 1 View  Result Date:  07/19/2017 CLINICAL DATA:  Respiratory failure EXAM: PORTABLE CHEST 1 VIEW COMPARISON:  07/18/2017 FINDINGS: Residual right lower lobe opacity, favoring improving pneumonia. Additional mild residual opacity in the right upper lobe. Mild left basilar opacity is possible, although this appearance suggests atelectasis. No pleural effusion or pneumothorax. Cardiomegaly. IMPRESSION: Residual right upper and lower lobe opacities, favoring improving pneumonia. Electronically Signed   By: Charline BillsSriyesh  Krishnan M.D.   On: 07/19/2017 07:30   Dg Chest Port 1 View  Result Date: 07/18/2017 CLINICAL DATA:  Respiratory failure EXAM: PORTABLE CHEST 1 VIEW COMPARISON:  July 17, 2017 FINDINGS: The patient's chin overlies her upper left chest limiting evaluation. No pneumothorax. The cardiomediastinal silhouette is stable. The opacity seen previously in the right lung base is significantly improved. No other focal infiltrates. No nodules or masses. IMPRESSION: Improving opacity in the right lung base.  No other change. Electronically Signed   By: Gerome Samavid  Williams III M.D   On: 07/18/2017 07:18     ASSESSMENT AND PLAN:    84 year old female with history of dementia who presents from nursing home with respiratory distress.  1. Acute hypoxic respiratory failure due to sepsis and bilateral aspiration pneumonia She has been weaned off BiPAP and on nasal cannula Wean to room air as tolerated  2. Sepsis: Patient presents with tachypnea, tachycardia and leukocytosis Sepsis is due to bilateral aspiration pneumonia and urinary tract infection Continue Zosyn with plans to change to Augmentin  Follow-up on final cultures   3. Aspiration pneumonia: Continue Zosyn Speech consult  4. Norovirus on GI panel: supportive care  5. UTI: No urine culture obtained  6. Acute encephalopathy in the setting of acute hypoxic respiratory failure with sepsis on top of underlying dementia:  She is at baseline  Continue Namenda for dementia    7. Essential hypertension: Continue Norvasc   8. Hypokalemia: Resolved 9. Acute kidney Injury: This is on the setting of poor po intake and sepsis/ Resolved   CODE STATUS: DNR  TOTAL TIME TAKING CARE OF THIS PATIENT: 24 minutes.  Discussed with family  PT consultation  Needs palliative care consult as outpatient  POSSIBLE D/C tomorrow, DEPENDING ON CLINICAL CONDITION.   Shakita Keir M.D on 07/19/2017 at 8:10 AM  Between 7am to 6pm - Pager - [phone] After 6pm go to [email or web address] - password [email or web address]  [facility] [place] [facility]  Office  [phone]  CC: Primary care physician; Precious Reel, NP (Inactive)  Note: This dictation was prepared with Dragon dictation along with smaller phrase technology. Any transcriptional errors that result from this process are unintentional.

## 2017-07-19 NOTE — [place] FL2 (Signed)
  [place] MEDICAID FL2 LEVEL OF CARE SCREENING TOOL     IDENTIFICATION  Patient Name: Nancy Montgomery Birthdate: 11/30/1930 Sex: female Admission Date (Current Location): 07/17/2017  [place]   Facility and Address:  [facility], [address], [place], [place] [address]      Provider Number: [id number]  Attending Physician Name and Address:  Adrian SaranMody, Sital, MD  Relative Name and Phone Number:  Cathleen Cortinoch,Bernard Son   [phone]     Current Level of Care:   Recommended Level of Care:   Prior Approval Number:    Date Approved/Denied:   PASRR Number: [id number]  Discharge Plan: SNF    Current Diagnoses: Patient Active Problem List   Diagnosis Date Noted  . Acute respiratory failure (HCC) 07/17/2017  . Seizure (HCC) 12/02/2016  . Acute delirium 12/30/2015  . Metabolic encephalopathy 12/30/2015    Orientation RESPIRATION BLADDER Height & Weight     Self  O2(3L o2) Incontinent Weight: 125 lb (56.7 kg) Height:  5\' 2"  (157.5 cm)  BEHAVIORAL SYMPTOMS/MOOD NEUROLOGICAL BOWEL NUTRITION STATUS      Incontinent Diet(Dysphagia 1, thin liquids)  AMBULATORY STATUS COMMUNICATION OF NEEDS Skin   Extensive Assist Verbally Normal                       Personal Care Assistance Level of Assistance  Bathing, Feeding, Dressing Bathing Assistance: Maximum assistance Feeding assistance: Limited assistance Dressing Assistance: Maximum assistance     Functional Limitations Info  Hearing, Sight, Speech Sight Info: Adequate Hearing Info: Adequate Speech Info: Adequate    SPECIAL CARE FACTORS FREQUENCY  PT (By licensed PT)     PT Frequency: Up to 5X/Week              Contractures Contractures Info: Not present    Additional Factors Info  Code Status, Allergies, Isolation Precautions Code Status Info: DNR Allergies Info: No Known Allergies     Isolation Precautions Info: Contact precautions for VRE     Current  Medications (07/19/2017):  This is the current hospital active medication list Current Facility-Administered Medications  Medication Dose Route Frequency Provider Last Rate Last Dose  . acetaminophen (TYLENOL) suppository 650 mg  650 mg Rectal Q4H PRN Eugenie NorrieBlakeney, Dana G, NP   650 mg at 07/18/17 2317  . chlorhexidine (PERIDEX) 0.12 % solution 15 mL  15 mL Mouth Rinse BID Merwyn KatosSimonds, David B, MD   15 mL at 07/18/17 0915  . enoxaparin (LOVENOX) injection 40 mg  40 mg Subcutaneous Q24H Mody, Sital, MD      . loperamide (IMODIUM) 1 MG/5ML solution 2 mg  2 mg Oral PRN Salary, Montell D, MD      . MEDLINE mouth rinse  15 mL Mouth Rinse q12n4p Merwyn KatosSimonds, David B, MD   15 mL at 07/18/17 1445  . piperacillin-tazobactam (ZOSYN) IVPB 3.375 g  3.375 g Intravenous Q8H Mody, Sital, MD 12.5 mL/hr at 07/19/17 0222 3.375 g at 07/19/17 0222     Discharge Medications: Please see discharge summary for a list of discharge medications.  Relevant Imaging Results:  Relevant Lab Results:   Additional Information SS# [id number]  Judi CongKaren M Temari Schooler, LCSW

## 2017-07-19 NOTE — Progress Notes (Signed)
Anticoagulation monitoring(Lovenox):  84yo  female ordered Lovenox 30 mg Q24h  Filed Weights   07/17/17 0433  Weight: 125 lb (56.7 kg)   BMI 23   Lab Results  Component Value Date   CREATININE 0.96 07/19/2017   CREATININE 1.43 (H) 07/18/2017   CREATININE 1.20 (H) 07/17/2017   Estimated Creatinine Clearance: 34.5 mL/min (by C-G formula based on SCr of 0.96 mg/dL). Hemoglobin & Hematocrit     Component Value Date/Time   HGB 11.8 (L) 07/19/2017 0306   HCT 36.1 07/19/2017 0306     Per Protocol for Patient with estCrcl > 30 ml/min and BMI < 40, will transition to Lovenox 40 mg Q24h.

## 2017-07-19 NOTE — Progress Notes (Signed)
PT Cancellation Note  Patient Details Name: Kym Groomnnie M Janus MRN: [id number] DOB: 01/20/1931   Cancelled Treatment:    Reason Eval/Treat Not Completed: Fatigue/lethargy limiting ability to participate. Attempted to wake patient, pt not responding to verbal or continuous moderate rubbing of leg or shoulder. Pt visibly breathing and O2. RN notified. Will attempt again at later date/time.   Of note, patient is a long-term resident at [facility] and a [facility] program participant, does not need PT evaluation for DC planning purposes.    2:42 PM, 07/19/17 Rosamaria LintsAllan C Buccola, PT, DPT Physical Therapist - [facility]  [phone] (ASCOM)     Buccola,Allan C 07/19/2017, 2:37 PM

## 2017-07-20 MED ORDER — AMOXICILLIN-POT CLAVULANATE 875-125 MG PO TABS: 1.0000 | ORAL_TABLET | Freq: Two times a day (BID) | ORAL | 0 refills | Status: AC

## 2017-07-20 NOTE — Discharge Summary (Addendum)
[facility] [place] [facility]   PATIENT NAME: Nancy Montgomery    MR#:  [id number]  DATE OF BIRTH:  05/02/1931  DATE OF ADMISSION:  07/17/2017 ADMITTING PHYSICIAN: Bertrum SolMontell D Salary, MD  DATE OF DISCHARGE: 07/20/2017  PRIMARY CARE PHYSICIAN: Precious ReelBlake, Mary, NP (Inactive)    ADMISSION DIAGNOSIS:  Enteritis due to Norovirus [A08.11] Sepsis, due to unspecified organism [facility](HCC) [A41.9] Aspiration pneumonia of right lower lobe, unspecified aspiration pneumonia type (HCC) [J69.0]  DISCHARGE DIAGNOSIS:  Active Problems:   Acute respiratory failure (HCC)   SECONDARY DIAGNOSIS:   Past Medical History:  Diagnosis Date  . Alzheimer disease   . Diverticulosis   . Hypertension   . Prediabetes   . Renal disorder     HOSPITAL COURSE:  84 year old female with history of dementia who presents from nursing home with respiratory distress.  1. Acute hypoxic respiratory failure due to sepsis and bilateral aspiration pneumonia She has been weaned off BiPAP and on nasal cannula She will need oxygen upon discharge. This can be weaned off at the nursing home.  2. Sepsis: Patient presented with tachypnea, tachycardia and leukocytosis Sepsis is due to bilateral aspiration pneumonia and urinary tract infection She was on Zosyn while in the hospital. She is transitioned to oral Augmentin. She will need this for 5 more days. Stop date is 07/26/2017.   3. Aspiration pneumonia: She would benefit from outpatient speech evaluation. She will continue Augmentin for aspiration pneumonia. She will need aspiration precautions and to be monitored closely with ALL feedings.   4. Norovirus on GI panel: Her diarrhea has resolved.  5. UTI:  I was no urine culture obtained. 6. Acute encephalopathy in the setting of acute hypoxic respiratory failure with sepsis on top of underlying dementia:  She is at her baseline which is nonverbal. Continue Namenda for dementia   7. Essential  hypertension: Continue Norvasc   8. Hypokalemia: Resolved 9. Acute kidney injury: This is resolved  Patient would benefit from speech consultation at this facility.   DISCHARGE CONDITIONS AND DIET:   Stable for discharge on dysphagia 1 diet with thin liquids  CONSULTS OBTAINED:    DRUG ALLERGIES:  No Known Allergies  DISCHARGE MEDICATIONS:   Allergies as of 07/20/2017   No Known Allergies     Medication List    STOP taking these medications   loperamide 1 MG/5ML solution Commonly known as:  IMODIUM     TAKE these medications   acetaminophen 325 MG tablet Commonly known as:  TYLENOL Take 325-650 mg by mouth every 8 (eight) hours as needed for fever.   acetaminophen 100 MG/ML solution Commonly known as:  TYLENOL Take 500 mg by mouth every 6 (six) hours as needed for fever.   amLODipine 5 MG tablet Commonly known as:  NORVASC Take 5 mg by mouth daily.   amoxicillin-clavulanate 875-125 MG tablet Commonly known as:  AUGMENTIN Take 1 tablet by mouth 2 (two) times daily for 5 days.   dextromethorphan-guaiFENesin 30-600 MG 12hr tablet Commonly known as:  MUCINEX DM Take 1 tablet by mouth every 12 (twelve) hours as needed for cough.   magnesium hydroxide 400 MG/5ML suspension Commonly known as:  MILK OF MAGNESIA Take 30 mLs by mouth daily as needed for mild constipation.   memantine 10 MG tablet Commonly known as:  NAMENDA Take 10 mg by mouth daily.   ondansetron 4 MG disintegrating tablet Commonly known as:  ZOFRAN-ODT Take 4 mg by mouth every 8 (eight) hours as needed  for nausea or vomiting.   promethazine 12.5 MG suppository Commonly known as:  PHENERGAN Place 12.5 mg rectally every 6 (six) hours as needed for nausea or vomiting.   promethazine 25 MG/ML injection Commonly known as:  PHENERGAN Inject 12.5 mg into the vein every 4 (four) hours as needed for nausea or vomiting. (X 2)   Vitamin D-3 1000 units Caps Take 1,000 Units by mouth daily.          Today   CHIEF COMPLAINT:   No acute events reported overnight   VITAL SIGNS:  Blood pressure 139/60, pulse 91, temperature 99.1 F (37.3 C), temperature source Oral, resp. rate 16, height 5\' 2"  (1.575 m), weight 56.7 kg (125 lb), SpO2 95 %.   REVIEW OF SYSTEMS:  Review of Systems  Unable to perform ROS: Dementia     PHYSICAL EXAMINATION:  GENERAL:  84 y.o.-year-old patient lying in the bed with no acute distress.  NECK:  Supple, no jugular venous distention. No thyroid enlargement, no tenderness.  LUNGS: Right upper lobe bronchi  CARDIOVASCULAR: S1, S2 normal. No murmurs, rubs, or gallops.  ABDOMEN: Soft, non-tender, non-distended. Bowel sounds present. No organomegaly or mass.  EXTREMITIES: No pedal edema, cyanosis, or clubbing.  PSYCHIATRIC: Patient is awake has severe dementia nonverbal at baseline  SKIN: No obvious rash, lesion, or ulcer.   DATA REVIEW:   CBC Recent Labs  Lab 07/19/17 0306  WBC 6.6  HGB 11.8*  HCT 36.1  PLT 100*    Chemistries  Recent Labs  Lab 07/19/17 0306  NA 143  K 3.6  CL 113*  CO2 24  GLUCOSE 90  BUN 19  CREATININE 0.96  CALCIUM 8.2*  AST 33  ALT 19  ALKPHOS 35*  BILITOT 0.7    Cardiac Enzymes No results for input(s): TROPONINI in the last 168 hours.  Microbiology Results  @MICRORSLT48 @  RADIOLOGY:  Dg Chest Port 1 View  Result Date: 07/19/2017 CLINICAL DATA:  Respiratory failure EXAM: PORTABLE CHEST 1 VIEW COMPARISON:  07/18/2017 FINDINGS: Residual right lower lobe opacity, favoring improving pneumonia. Additional mild residual opacity in the right upper lobe. Mild left basilar opacity is possible, although this appearance suggests atelectasis. No pleural effusion or pneumothorax. Cardiomegaly. IMPRESSION: Residual right upper and lower lobe opacities, favoring improving pneumonia. Electronically Signed   By: Charline Bills M.D.   On: 07/19/2017 07:30      Allergies as of 07/20/2017   No Known Allergies      Medication List    STOP taking these medications   loperamide 1 MG/5ML solution Commonly known as:  IMODIUM     TAKE these medications   acetaminophen 325 MG tablet Commonly known as:  TYLENOL Take 325-650 mg by mouth every 8 (eight) hours as needed for fever.   acetaminophen 100 MG/ML solution Commonly known as:  TYLENOL Take 500 mg by mouth every 6 (six) hours as needed for fever.   amLODipine 5 MG tablet Commonly known as:  NORVASC Take 5 mg by mouth daily.   amoxicillin-clavulanate 875-125 MG tablet Commonly known as:  AUGMENTIN Take 1 tablet by mouth 2 (two) times daily for 5 days.   dextromethorphan-guaiFENesin 30-600 MG 12hr tablet Commonly known as:  MUCINEX DM Take 1 tablet by mouth every 12 (twelve) hours as needed for cough.   magnesium hydroxide 400 MG/5ML suspension Commonly known as:  MILK OF MAGNESIA Take 30 mLs by mouth daily as needed for mild constipation.   memantine 10 MG tablet Commonly known as:  NAMENDA Take 10 mg by mouth daily.   ondansetron 4 MG disintegrating tablet Commonly known as:  ZOFRAN-ODT Take 4 mg by mouth every 8 (eight) hours as needed for nausea or vomiting.   promethazine 12.5 MG suppository Commonly known as:  PHENERGAN Place 12.5 mg rectally every 6 (six) hours as needed for nausea or vomiting.   promethazine 25 MG/ML injection Commonly known as:  PHENERGAN Inject 12.5 mg into the vein every 4 (four) hours as needed for nausea or vomiting. (X 2)   Vitamin D-3 1000 units Caps Take 1,000 Units by mouth daily.        Management plans discussed with the patient's daughter and she is in agreement. Stable for discharge SNF  Patient should follow up with pcp  CODE STATUS:     Code Status Orders  (From admission, onward)        Start     Ordered   07/17/17 0821  Do not attempt resuscitation (DNR)  Continuous    Question Answer Comment  In the event of cardiac or respiratory ARREST Do not call a "code blue"    In the event of cardiac or respiratory ARREST Do not perform Intubation, CPR, defibrillation or ACLS   In the event of cardiac or respiratory ARREST Use medication by any route, position, wound care, and other measures to relive pain and suffering. May use oxygen, suction and manual treatment of airway obstruction as needed for comfort.      07/17/17 0820    Code Status History    Date Active Date Inactive Code Status Order ID Comments User Context   12/02/2016 16:13 12/05/2016 18:55 DNR [id number]  Gracelyn Nurse, MD Inpatient   12/30/2015 13:09 01/01/2016 19:03 Full Code [id number]  Hower, Cletis [place], MD ED    Advance Directive Documentation     Most Recent Value  Type of Advance Directive  Healthcare Power of Attorney  Pre-existing out of facility DNR order (yellow form or pink MOST form)  No data  "MOST" Form in Place?  No data      TOTAL TIME TAKING CARE OF THIS PATIENT: 38 minutes.    Note: This dictation was prepared with Dragon dictation along with smaller phrase technology. Any transcriptional errors that result from this process are unintentional.  Lyndall Windt M.D on 07/20/2017 at 8:06 AM  Between 7am to 6pm - Pager - [phone] After 6pm go to [email or web address] - Social research officer, government  [facility]  Office  [phone]  CC: Primary care physician; Precious Reel, NP (Inactive)

## 2017-07-20 NOTE — Progress Notes (Signed)
[facility] is [facility] in [facility] stable condition , report given to floor nurse , pt will [facility] ,

## 2017-07-20 NOTE — Progress Notes (Signed)
Patient is medically stable for D/C back to [facility] today under [facility]. [facility] are aware that patient is positive for the norovirus. Per Marcelino DusterMichelle patient can return today to private room 224. RN will call report at [phone] and [facility] will transport at 11:30 am. Clinical Social Worker (CSW) sent D/C orders to [facility] via [employer]. CSW contacted patient's niece Lowell GuitarOmetta and made her aware of above. Please reconsult if future social work needs arise. CSW signing off.   [employer]Bailey Talayeh Bruinsma, LCSW [phone]

## 2017-07-20 NOTE — Plan of Care (Signed)
  Education: Knowledge of General Education information will improve 07/20/2017 1004 - Progressing by Tomie ChinaJackson, Magen Suriano Cecelie, RN   Education: Knowledge of General Education information will improve 07/20/2017 1004 - Progressing by Tomie ChinaJackson, Yehonatan Grandison Cecelie, RN   Education: Knowledge of General Education information will improve 07/20/2017 1004 - Progressing by Tomie ChinaJackson, Rally Ouch Cecelie, RN   Education: Knowledge of General Education information will improve 07/20/2017 1004 - Progressing by Tomie ChinaJackson, Magaline Steinberg Cecelie, RN   Education: Knowledge of General Education information will improve 07/20/2017 1004 - Progressing by Tomie ChinaJackson, Oday Ridings Cecelie, RN   Education: Knowledge of General Education information will improve 07/20/2017 1004 - Progressing by Tomie ChinaJackson, Ihan Pat Cecelie, RN   Education: Knowledge of General Education information will improve 07/20/2017 1004 - Progressing by Tomie ChinaJackson, Lamir Racca Cecelie, RN   Education: Knowledge of General Education information will improve 07/20/2017 1004 - Progressing by Tomie ChinaJackson, Lauree Yurick Cecelie, RN

## 2017-07-20 NOTE — Evaluation (Signed)
Clinical/Bedside Swallow Evaluation Patient Details  Name: Nancy Montgomery MRN: [id number] Date of Birth: [birth year]  Today's Date: 07/20/2017 Time: SLP Start Time (ACUTE ONLY): 1104 SLP Stop Time (ACUTE ONLY): 1204 SLP Time Calculation (min) (ACUTE ONLY): 60 min  Past Medical History:  Past Medical History:  Diagnosis Date  . Alzheimer disease   . Diverticulosis   . Hypertension   . Prediabetes   . Renal disorder    Past Surgical History:  Past Surgical History:  Procedure Laterality Date  . NO PAST SURGERIES     HPI:  Pt is a 84 y.o. female with a known history of advanced Alzheimer's Dementia, HTN, preDM presenting from nursing home with acute respiratory failure/hypoxia, suspected to be related to aspiration, patient was also noted to have diarrhea while there, ER workup noted for tachycardia, tachypnea, hypoxia, chest x-ray noted for bilateral pneumonia, urinalysis positive concerning for UTI, patient placed on BiPAP for acute respiratory failure now weaned from BiPAP. Patient admitted for acute hypoxic respiratory failure secondary to sepsis due to aspiration pneumonia/UTI. Pt is more alert, smiling but nonverbal. Noted congested upper respiratory inhalation/exhalations at rest. Pt accepted po trials when presented to her lips; required 100% assistance.    Assessment / Plan / Recommendation Clinical Impression  Pt appears to present w/ increased risk for aspiration secondary to oral phase dysphagia noted during this evaluation. Any oral phase deficits, and Cognitive decline(baseline Alzeheimer's Dementia), can increase risk for pharyngeal phase dysphagia, aspiration, Such dysphagia can impact Pulmonary status and Nutritional status compromising both. Pt required 100% feeding support presenting po trials to lips. Pt responded to visual/tactile stimulation by opening mouth and accepting po trials. Once taken orally, pt exhibited oral holding w/ lengthy oral phase time; noted rumminating  gumming/chewing behavior as well though bolus trials did not require mastication. Suspect this behavior is related to her baseline Dementia. throughout po trials, noted congested respirations to continue as was noted at rest(prior to po's). Due to pt's presentation of dysphagia, declined Cognitive status/Dementia, and increased risk for aspiration, recommend continue w/ current diet of Dysphagia level 1(puree) w/ thin liquids w/ STRICT aspiration precautions; pills in puree - CRUSHED as able or in liquid form. Recommend 100% supervision during feeding at meals noting Oral Clearing b/t bites/sips. Recommend Dietician f/u for nutritional support(ie., drink supplement) as well as monitoring for need to downgrade diet consistency(liquids) if any suspected aspiration and Pulmonary compromise occurs. Also recommend Palliative Care f/u for goals of care d/t dysphagia and Cognitive decline.        Aspiration Risk  Mild-Moderate aspiration risk    Diet Recommendation  Dysphagia level 1 w/ Thin liquids. Strict aspiration precautions; 100% supervision and support feeding at meals. Monitor for any Pulmonary decline and f/u w/ MD.   Medication Administration: Crushed with puree(or in liquid form mixed in puree for safer swallowing)    Other  Recommendations Recommended Consults: (Dietician consult; Palliative Care consult) Oral Care Recommendations: Oral care BID;Staff/trained caregiver to provide oral care Other Recommendations: (TBD if need arises)   Follow up Recommendations Skilled Nursing facility(TBD)      Frequency and Duration min 2x/week  2 weeks       Prognosis Prognosis for Safe Diet Advancement: Guarded Barriers to Reach Goals: Cognitive deficits;Time post onset;Severity of deficits;Behavior      Swallow Study   General Date of Onset: 07/17/17 HPI: Pt is a 84 y.o. female with a known history of advanced Alzheimer's Dementia, HTN, preDM presenting from nursing home with acute  respiratory  failure/hypoxia, suspected to be related to aspiration, patient was also noted to have diarrhea while there, ER workup noted for tachycardia, tachypnea, hypoxia, chest x-ray noted for bilateral pneumonia, urinalysis positive concerning for UTI, patient placed on BiPAP for acute respiratory failure now weaned from BiPAP. Patient admitted for acute hypoxic respiratory failure secondary to sepsis due to aspiration pneumonia/UTI. Pt is more alert, smiling but nonverbal. Noted congested upper respiratory inhalation/exhalations at rest. Pt accepted po trials when presented to her lips; required 100% assistance.  Type of Study: Bedside Swallow Evaluation Previous Swallow Assessment: unknown Diet Prior to this Study: Dysphagia 1 (puree);Thin liquids Temperature Spikes Noted: (wbc 6.6;  temp 99.2) Respiratory Status: Nasal cannula(1-2 liters) History of Recent Intubation: No Behavior/Cognition: Cooperative;Pleasant mood;Confused;Distractible;Requires cueing;Doesn't follow directions(nonverbal but chuckled/smiled) Oral Cavity Assessment: (difficult to assess d/t Cognitive status) Oral Care Completed by SLP: Recent completion by staff Oral Cavity - Dentition: Missing dentition(difficult to assess d/t Cognitive status) Vision: (n/a) Self-Feeding Abilities: Total assist Patient Positioning: Upright in bed Baseline Vocal Quality: (nonverbal) Volitional Cough: Cognitively unable to elicit Volitional Swallow: Unable to elicit    Oral/Motor/Sensory Function Overall Oral Motor/Sensory Function: (difficult to assess d/t Cognitive status; appeared wfl w/ po)   Ice Chips Ice chips: Impaired Presentation: Spoon(fed; 3 trials) Oral Phase Impairments: Poor awareness of bolus(Cognitive impact) Oral Phase Functional Implications: Oral holding Pharyngeal Phase Impairments: (no overt) Other Comments: congested respirations continued as noted at baseline   Thin Liquid Thin Liquid: Impaired Presentation: Straw(fed; 4  trials) Oral Phase Impairments: Poor awareness of bolus(Cognitive impact) Oral Phase Functional Implications: Oral holding Pharyngeal  Phase Impairments: (no overt) Other Comments: congested respirations continued as noted at baseline    Nectar Thick Nectar Thick Liquid: Impaired Presentation: Straw(4 trials) Oral Phase Impairments: Poor awareness of bolus(Cognitive impact) Oral phase functional implications: Oral holding Pharyngeal Phase Impairments: (no overt) Other Comments: congested respirations continued as noted at baseline   Honey Thick Honey Thick Liquid: Not tested   Puree Puree: Impaired Presentation: Spoon(fed; 4 trials) Oral Phase Impairments: Poor awareness of bolus(Cognitive impact) Oral Phase Functional Implications: Oral holding Pharyngeal Phase Impairments: (no overt) Other Comments: congested respirations continued as noted at baseline   Solid   GO   Solid: Not tested        Jerilynn SomKatherine Tyde Lamison, MS, CCC-SLP Sherrell Weir 07/20/2017,12:07 PM

## 2017-07-20 NOTE — Consult Note (Signed)
Pharmacy is consulted for the crush- ability of pt medications mucinex DM cannot be crushed- consider changing to guaifenesin liquid q 4-6 hr prn and delsum liquid BID prn augmentin 875mg  tabs can be crushed but consider changing to liquid for ease of use  Jair Lindblad D Batina Dougan, Pharm.D, BCPS Clinical Pharmacist

## 2017-07-22 LAB — CULTURE, BLOOD (ROUTINE X 2)
CULTURE: NO GROWTH
Culture: NO GROWTH

## 2018-05-19 DEATH — deceased

## 2018-07-19 IMAGING — DX DG CHEST 1V PORT
1 series · 1 of 1 positions shown · non-contrast
Comparison: July 17, 2017

CLINICAL DATA: Respiratory failure

EXAM:
PORTABLE CHEST 1 VIEW

[chest ap]
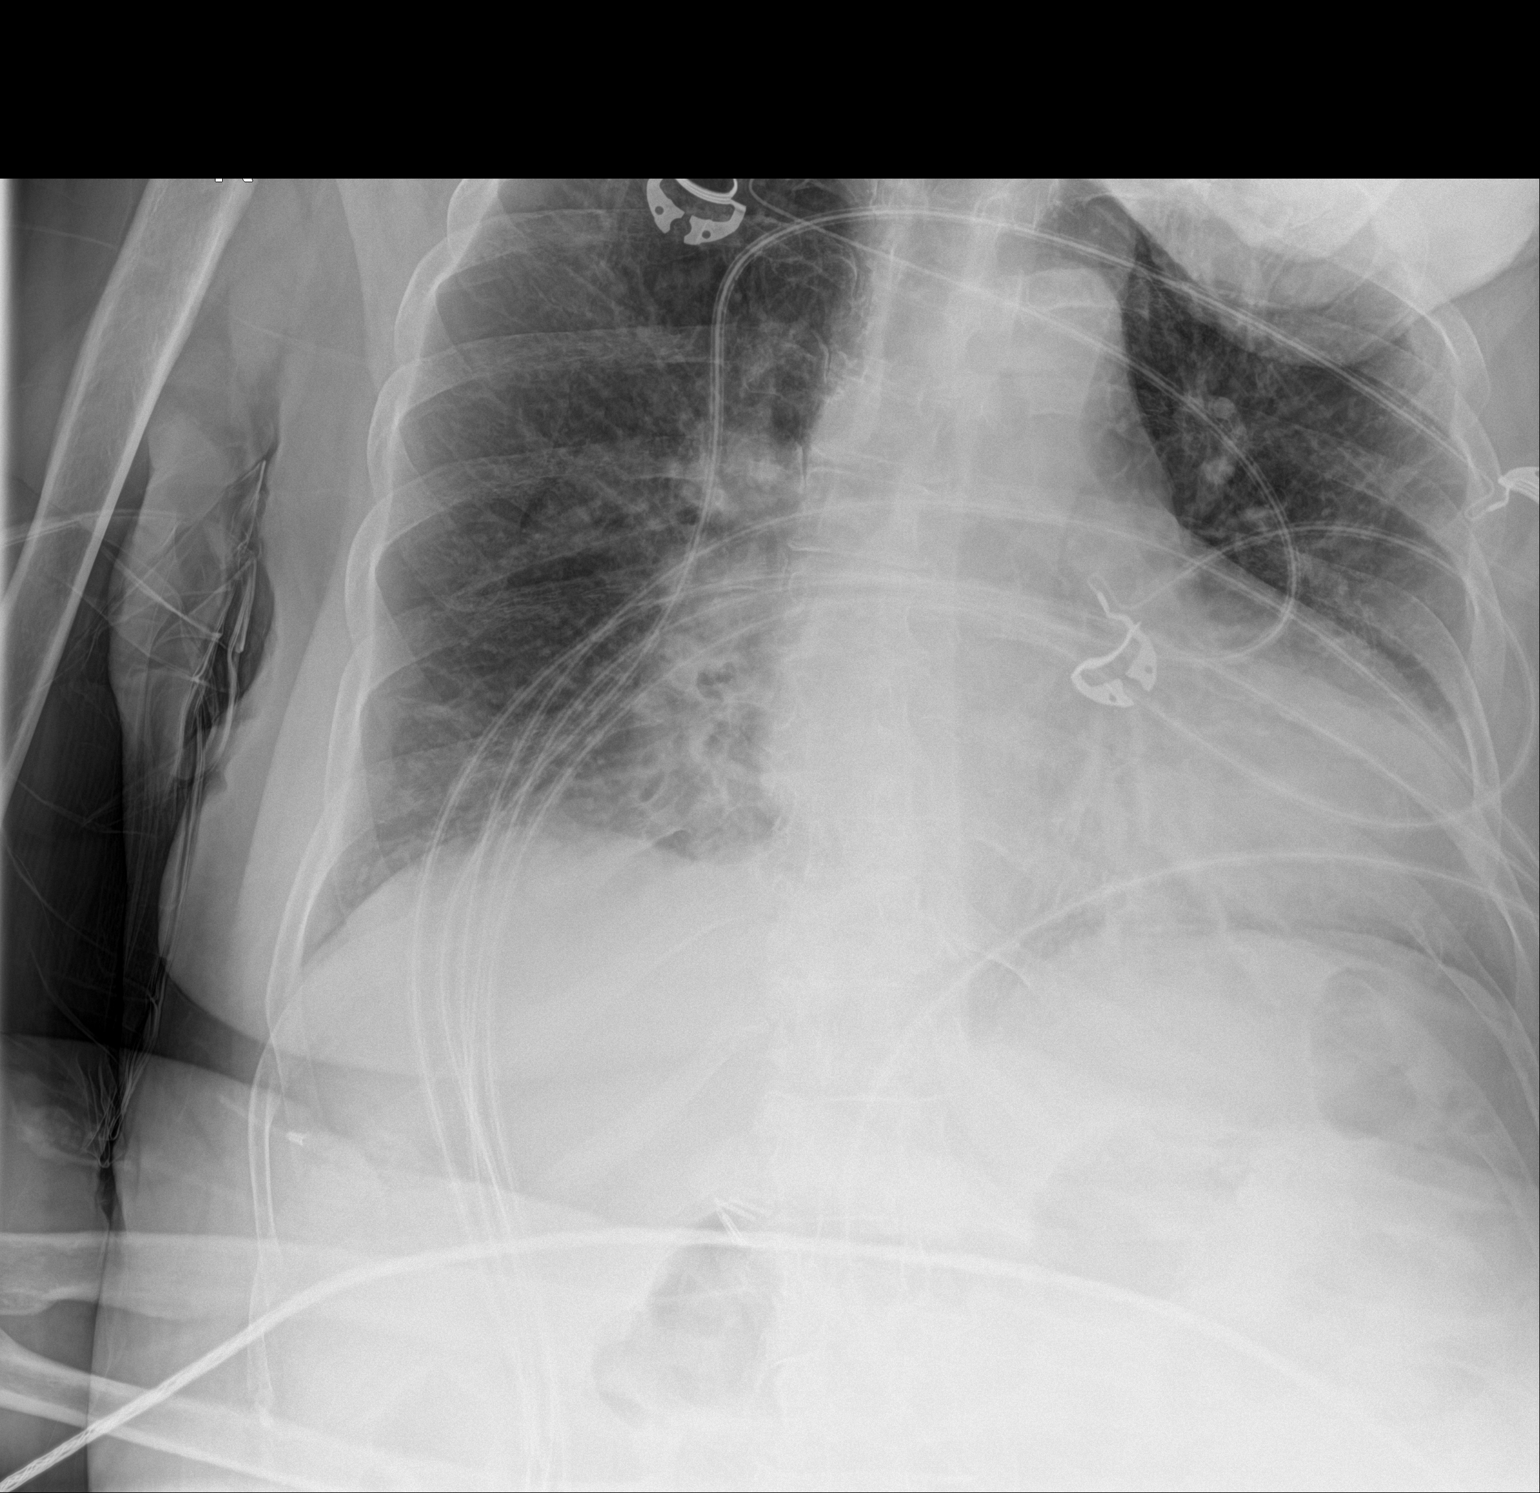

[1 of 1 positions shown; findings below may reference images not displayed]

FINDINGS: The patient's chin overlies her upper left chest limiting
evaluation. No pneumothorax. The cardiomediastinal silhouette is
stable. The opacity seen previously in the right lung base is
significantly improved. No other focal infiltrates. No nodules or
masses.
IMPRESSION: Improving opacity in the right lung base.  No other change.

## 2018-07-20 IMAGING — DX DG CHEST 1V PORT
1 series · 1 of 1 positions shown · non-contrast
Comparison: 07/18/2017

CLINICAL DATA: Respiratory failure

EXAM:
PORTABLE CHEST 1 VIEW

[chest ap]
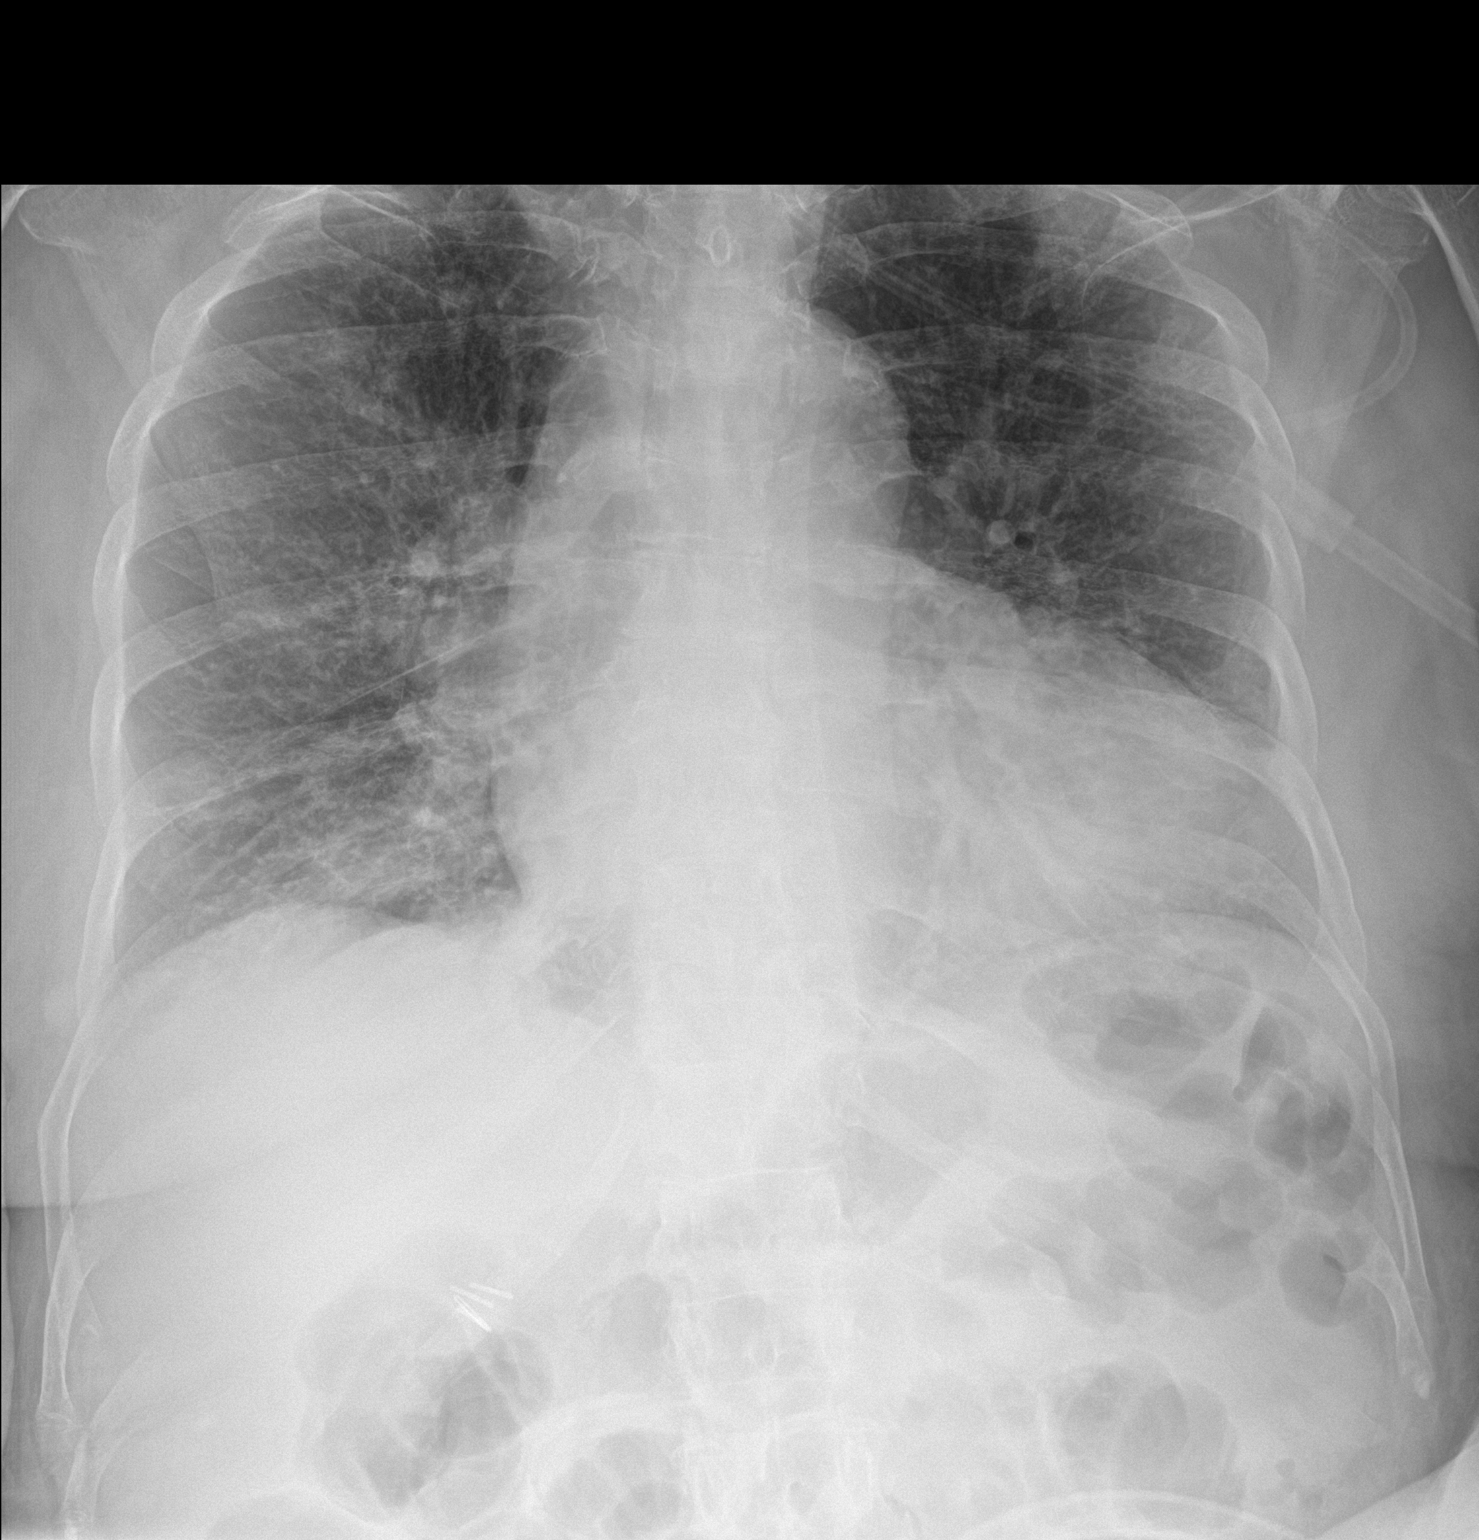

[1 of 1 positions shown; findings below may reference images not displayed]

FINDINGS: Residual right lower lobe opacity, favoring improving pneumonia.
Additional mild residual opacity in the right upper lobe.

Mild left basilar opacity is possible, although this appearance
suggests atelectasis. No pleural effusion or pneumothorax.

Cardiomegaly.
IMPRESSION: Residual right upper and lower lobe opacities, favoring improving
pneumonia.
